# Patient Record
Sex: Male | Born: 1985 | Race: White | Hispanic: No | Marital: Single | Smoking: Current every day smoker
Health system: Southern US, Community
[De-identification: ages and names within clinical notes are randomized; demographics above are authoritative.]

## PROBLEM LIST (undated history)

## (undated) DIAGNOSIS — G40909 Epilepsy, unspecified, not intractable, without status epilepticus: Secondary | ICD-10-CM

## (undated) DIAGNOSIS — R569 Unspecified convulsions: Secondary | ICD-10-CM

## (undated) HISTORY — PX: ELBOW SURGERY: SHX618

---

## 2014-05-09 ENCOUNTER — Emergency Department: Payer: Self-pay | Admitting: Emergency Medicine

## 2015-02-14 ENCOUNTER — Encounter: Payer: Self-pay | Admitting: Emergency Medicine

## 2015-02-14 ENCOUNTER — Emergency Department
Admission: EM | Admit: 2015-02-14 | Discharge: 2015-02-14 | Disposition: A | Discharge status: Home / Self Care | Payer: Self-pay | Attending: Emergency Medicine | Admitting: Emergency Medicine

## 2015-02-14 DIAGNOSIS — Y998 Other external cause status: Secondary | ICD-10-CM | POA: Insufficient documentation

## 2015-02-14 DIAGNOSIS — X58XXXA Exposure to other specified factors, initial encounter: Secondary | ICD-10-CM | POA: Insufficient documentation

## 2015-02-14 DIAGNOSIS — Z720 Tobacco use: Secondary | ICD-10-CM | POA: Insufficient documentation

## 2015-02-14 DIAGNOSIS — Y9289 Other specified places as the place of occurrence of the external cause: Secondary | ICD-10-CM | POA: Insufficient documentation

## 2015-02-14 DIAGNOSIS — T1581XA Foreign body in other and multiple parts of external eye, right eye, initial encounter: Secondary | ICD-10-CM | POA: Insufficient documentation

## 2015-02-14 DIAGNOSIS — Y9389 Activity, other specified: Secondary | ICD-10-CM | POA: Insufficient documentation

## 2015-02-14 DIAGNOSIS — T1591XA Foreign body on external eye, part unspecified, right eye, initial encounter: Secondary | ICD-10-CM

## 2015-02-14 MED ORDER — TOBRAMYCIN 0.3 % OP SOLN: 2.0000 [drp] | OPHTHALMIC | Status: DC

## 2015-02-14 MED ORDER — TETRACAINE HCL 0.5 % OP SOLN
1.0000 [drp] | Freq: Once | OPHTHALMIC | Status: AC
  Administered 2015-02-14: 1 [drp] via OPHTHALMIC
  Filled 2015-02-14: qty 2

## 2015-02-14 MED ORDER — EYE WASH OPHTH SOLN
1.0000 [drp] | OPHTHALMIC | Status: DC | PRN
  Administered 2015-02-14: 1 [drp] via OPHTHALMIC
  Filled 2015-02-14: qty 118

## 2015-02-14 MED ORDER — FLUORESCEIN SODIUM 1 MG OP STRP
1.0000 | ORAL_STRIP | Freq: Once | OPHTHALMIC | Status: AC
  Administered 2015-02-14: 1 via OPHTHALMIC
  Filled 2015-02-14: qty 1

## 2015-02-14 NOTE — ED Notes (Signed)
Pt reports piece of metal in eye x1 week; reports continued pain. Reports trying to flush out with water. Pt reports some blurred vision.

## 2015-02-14 NOTE — ED Provider Notes (Signed)
[facility] Emergency Department Provider Note  ____________________________________________  Time seen: Approximately 6:38 PM  I have reviewed the triage vital signs and the nursing notes.   HISTORY  Chief Complaint Foreign Body in Eye   HPI Rodney Freeman is a 29 y.o. male is here complaining apiece metal in his eye for approximately one week. He states he was not able to see until the day and has not had any pain until today. He has some minimal photophobia and some blurred vision. He has tried multiple times to flush the piece of metal out. He has had similar to this in the past which was removed without any difficulty. Currently his pain is 5 out of 10. Visual acuity was obtained by the nurses.   History reviewed. No pertinent past medical history.  There are no active problems to display for this patient.   Past Surgical History  Procedure Laterality Date  . Elbow surgery Right     Current Outpatient Rx  Name  Route  Sig  Dispense  Refill  . tobramycin (TOBREX) 0.3 % ophthalmic solution   Right Eye   Place 2 drops into the right eye every 4 (four) hours.   5 mL   0     Allergies Review of patient's allergies indicates no known allergies.  No family history on file.  Social History Social History  Substance Use Topics  . Smoking status: Current Every Day Smoker  . Smokeless tobacco: None  . Alcohol Use: No    Review of Systems Constitutional: No fever/chills Eyes: Blurred vision ENT: No sore throat. Cardiovascular: Denies chest pain. Respiratory: Denies shortness of breath. Gastrointestinal:   No nausea, no vomiting.  Genitourinary: Negative for dysuria. Musculoskeletal: Negative for back pain. Skin: Negative for rash. Neurological: Negative for headaches, focal weakness or numbness.  10-point ROS otherwise negative.  ____________________________________________   PHYSICAL EXAM:  VITAL SIGNS: ED Triage Vitals  Enc  Vitals Group     BP 02/14/15 1805 131/80 mmHg     Pulse Rate 02/14/15 1805 68     Resp 02/14/15 1805 16     Temp 02/14/15 1805 97.8 F (36.6 C)     Temp Source 02/14/15 1805 Oral     SpO2 02/14/15 1805 97 %     Weight 02/14/15 1805 140 lb (63.504 kg)     Height 02/14/15 1805  (1.727 m)     Head Cir --      Peak Flow --      Pain Score 02/14/15 1805 5     Pain Loc --      Pain Edu? --      Excl. in GC? --     Constitutional: Alert and oriented. Well appearing and in no acute distress. Eyes: PERRL. EOMI. visual acuity was 20/25 in both eyes. Right right is injected with pinpoint metallic foreign body at approximately 10:00 position on the iris. Head: Atraumatic. Nose: No congestion/rhinnorhea. Neck: No stridor.   Cardiovascular: Normal rate, regular rhythm. Grossly normal heart sounds.  Good peripheral circulation. Respiratory: Normal respiratory effort.  No retractions. Lungs CTAB. Gastrointestinal: Soft and nontender. No distention.  Musculoskeletal: No lower extremity tenderness nor edema.  No joint effusions. Neurologic:  Normal speech and language. No gross focal neurologic deficits are appreciated. No gait instability. Skin:  Skin is warm, dry and intact. No rash noted. Psychiatric: Mood and affect are normal. Speech and behavior are normal.  ____________________________________________   LABS (all labs ordered are listed,  but only abnormal results are displayed)  Labs Reviewed - No data to display  PROCEDURES  Procedure(s) performed: Tetracaine was placed in the right eye. Upper eyelid was inverted without foreign body noted. Metallic foreign body was noted at approximately 10:00 position on the iris. This was removed with a wet Q-tip. Fluorescein dye was placed with uptake in the same area. Eye was irrigated with ophthalmic eyewash. No remaining foreign body was noted.  Critical Care performed: No  ____________________________________________   INITIAL  IMPRESSION / ASSESSMENT AND PLAN / ED COURSE  Pertinent labs & imaging results that were available during my care of the patient were reviewed by me and considered in my medical decision making (see chart for details).  Patient was advised to follow-up at [facility]. He is red prescription for Tobrex ophthalmic to be used every 4 hours while awake. He is to return to the emergency room the weekend if any severe worsening of his symptoms. ____________________________________________   FINAL CLINICAL IMPRESSION(S) / ED DIAGNOSES  Final diagnoses:  Foreign body, eye, right, initial encounter      Tommi Rumps, PA-C 02/14/15 1955  Arnaldo Natal, MD 02/14/15 2127

## 2015-08-11 ENCOUNTER — Emergency Department
Admission: EM | Admit: 2015-08-11 | Discharge: 2015-08-11 | Disposition: A | Discharge status: Home / Self Care | Payer: Self-pay | Attending: Emergency Medicine | Admitting: Emergency Medicine

## 2015-08-11 ENCOUNTER — Encounter: Payer: Self-pay | Admitting: *Deleted

## 2015-08-11 DIAGNOSIS — Y9289 Other specified places as the place of occurrence of the external cause: Secondary | ICD-10-CM | POA: Insufficient documentation

## 2015-08-11 DIAGNOSIS — W228XXA Striking against or struck by other objects, initial encounter: Secondary | ICD-10-CM | POA: Insufficient documentation

## 2015-08-11 DIAGNOSIS — T1502XA Foreign body in cornea, left eye, initial encounter: Secondary | ICD-10-CM | POA: Insufficient documentation

## 2015-08-11 DIAGNOSIS — Y990 Civilian activity done for income or pay: Secondary | ICD-10-CM | POA: Insufficient documentation

## 2015-08-11 DIAGNOSIS — H18892 Other specified disorders of cornea, left eye: Secondary | ICD-10-CM

## 2015-08-11 DIAGNOSIS — Y9389 Activity, other specified: Secondary | ICD-10-CM | POA: Insufficient documentation

## 2015-08-11 DIAGNOSIS — T1592XA Foreign body on external eye, part unspecified, left eye, initial encounter: Secondary | ICD-10-CM

## 2015-08-11 DIAGNOSIS — Z79899 Other long term (current) drug therapy: Secondary | ICD-10-CM | POA: Insufficient documentation

## 2015-08-11 DIAGNOSIS — F17200 Nicotine dependence, unspecified, uncomplicated: Secondary | ICD-10-CM | POA: Insufficient documentation

## 2015-08-11 MED ORDER — TETRACAINE HCL 0.5 % OP SOLN
OPHTHALMIC | Status: AC
  Administered 2015-08-11: 2 [drp] via OPHTHALMIC
  Filled 2015-08-11: qty 2

## 2015-08-11 MED ORDER — ERYTHROMYCIN 5 MG/GM OP OINT
TOPICAL_OINTMENT | Freq: Once | OPHTHALMIC | Status: AC
  Administered 2015-08-11: 1 via OPHTHALMIC

## 2015-08-11 MED ORDER — ERYTHROMYCIN 5 MG/GM OP OINT
TOPICAL_OINTMENT | OPHTHALMIC | Status: AC
  Administered 2015-08-11: 1 via OPHTHALMIC
  Filled 2015-08-11: qty 1

## 2015-08-11 MED ORDER — FLUORESCEIN SODIUM 1 MG OP STRP
1.0000 | ORAL_STRIP | Freq: Once | OPHTHALMIC | Status: AC
  Administered 2015-08-11: 1 via OPHTHALMIC

## 2015-08-11 MED ORDER — FLUORESCEIN SODIUM 1 MG OP STRP
ORAL_STRIP | OPHTHALMIC | Status: AC
  Administered 2015-08-11: 1 via OPHTHALMIC
  Filled 2015-08-11: qty 1

## 2015-08-11 MED ORDER — TETRACAINE HCL 0.5 % OP SOLN
1.0000 [drp] | Freq: Once | OPHTHALMIC | Status: AC
  Administered 2015-08-11: 2 [drp] via OPHTHALMIC

## 2015-08-11 NOTE — ED Notes (Signed)
Patient with no complaints at this time. Respirations even and unlabored. Skin warm/dry. Discharge instructions reviewed with patient at this time. Patient given opportunity to voice concerns/ask questions. Patient discharged at this time and left Emergency Department with steady gait.   

## 2015-08-11 NOTE — ED Provider Notes (Signed)
[facility] Emergency Department Provider Note  ____________________________________________  Time seen: Approximately 3:40 AM  I have reviewed the triage vital signs and the nursing notes.   HISTORY  Chief Complaint Foreign Body in Eye    HPI Rodney Freeman is a 30 y.o. male who comes into the hospital with something in his eye. The patient reports that he thinks he has a piece of metal stuck in his left eye. It occurred while he was at work he said sometime between 8 AM and 5 PM but he is unsure exactly the time. He reports that he went home but his eye was irritating him so he decided to come in to get it taken out. He tried flushing it out and also tried to rub it out with a Q-tip but it did not help. 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 He reports that he was wearing eye protection at the time of the incident.   No past medical history  There are no active problems to display for this patient.   Past Surgical History  Procedure Laterality Date  . Elbow surgery Right     Current Outpatient Rx  Name  Route  Sig  Dispense  Refill  . tobramycin (TOBREX) 0.3 % ophthalmic solution   Right Eye   Place 2 drops into the right eye every 4 (four) hours.   5 mL   0     Allergies Review of patient's allergies indicates no known allergies.  No family history on file.  Social History Social History  Substance Use Topics  . Smoking status: Current Every Day Smoker  . Smokeless tobacco: None  . Alcohol Use: No    Review of Systems Constitutional: No fever/chills Eyes: No visual changes. ENT: Left eye foreign body Cardiovascular: Denies chest pain. Respiratory: Denies shortness of breath. Gastrointestinal: No abdominal pain.  No nausea, no vomiting.  No diarrhea.  No  constipation. Genitourinary: Negative for dysuria. Musculoskeletal: Negative for back pain. Skin: Negative for rash. Neurological: Negative for headaches, focal weakness or numbness.  10-point ROS otherwise negative.  ____________________________________________   PHYSICAL EXAM:  VITAL SIGNS: ED Triage Vitals  Enc Vitals Group     BP 08/11/15 0220 125/79 mmHg     Pulse Rate 08/11/15 0220 64     Resp 08/11/15 0220 18     Temp 08/11/15 0227 97.5 F (36.4 C)     Temp Source 08/11/15 0220 Oral     SpO2 08/11/15 0220 98 %     Weight 08/11/15 0220 135 lb (61.236 kg)     Height 08/11/15 0220  (1.702 m)     Head Cir --      Peak Flow --      Pain Score 08/11/15 0220 3     Pain Loc --      Pain Edu? --      Excl. in GC? --     Constitutional: Alert and oriented. Well appearing and in no acute distress. Eyes: The patient has a foreign body noted to his left eye over the iris with some mild erythema. Head: Atraumatic. Nose: No congestion/rhinnorhea. Mouth/Throat: Mucous membranes are moist.  Oropharynx non-erythematous. Cardiovascular: Normal rate, regular rhythm. Grossly normal heart sounds.   Respiratory: Normal respiratory effort.   Gastrointestinal: Soft and nontender,  Positive bowel sounds Musculoskeletal: No lower extremity tenderness nor edema. Neurologic:  Normal speech and language.  Skin:  Skin is warm, dry and intact.  Psychiatric: Mood and affect are normal.   ____________________________________________   LABS (all labs ordered are listed, but only abnormal results are displayed)  Labs Reviewed - No data to display ____________________________________________  EKG  None ____________________________________________  RADIOLOGY  None ____________________________________________   PROCEDURES  Procedure(s) performed: None  Critical Care performed: No  ____________________________________________   INITIAL IMPRESSION / ASSESSMENT AND PLAN /  ED COURSE  Pertinent labs & imaging results that were available during my care of the patient were reviewed by me and considered in my medical decision making (see chart for details).  This is a 30 year old male who comes into the hospital today with the left eye foreign body. The patient had this in his eye for most of the day. I did give the patient some tetracaine drops to his left eye and remove the foreign body with an 18-gauge needle. The patient did have a residual rust ring noted after the removal of the foreign body. I attempted to get some of it out but was unable to do so. I will have the patient follow-up with ophthalmology to have the rust ring removed. The patient received some erythromycin ointment here and be discharged to follow-up. ____________________________________________   FINAL CLINICAL IMPRESSION(S) / ED DIAGNOSES  Final diagnoses:  Eye foreign body, left, initial encounter  Corneal rust ring of left eye      Rebecka Apley, MD 08/11/15 [phone]

## 2015-08-11 NOTE — Discharge Instructions (Signed)
Eye Foreign Body  A foreign body refers to any object on the surface of the eye or in the eyeball that should not be there. A foreign body may be a small speck of dirt or dust, a hair or eyelash, a splinter, or any other object.   SIGNS AND SYMPTOMS  Symptoms depend on what the foreign body is and where it is in the eye. The most common locations are:    On the inner surface of the upper or lower eyelids or on the covering of the white part of the eye (conjunctiva). Symptoms in this location are:    Pain and irritation, especially when blinking.    The feeling that something is in the eye.   On the surface of the clear covering on the front of the eye (cornea). Symptoms in this location include:    Pain and irritation.     Small "rust rings" around a metallic foreign body.    The feeling that something is in the eye.    Inside the eyeball. Foreign bodies inside the eye may cause:     Great pain.     Immediate loss of vision.     Distortion of the pupil.  DIAGNOSIS   Foreign bodies are found during an exam by an eye specialist. Those on the eyelids, conjunctiva, or cornea are usually (but not always) easily found. When a foreign body is inside the eyeball, a cloudiness of the lens (cataract) may form almost right away. This makes it hard for an eye specialist to find the foreign body. Tests may be needed, including ultrasound testing, X-rays, and CT scans.  TREATMENT    Foreign bodies on the eyelids, conjunctiva, or cornea are often removed easily and painlessly.   Rust in the cornea may require the use of a drill-like instrument to remove the rust.   If the foreign body has caused a scratch or a rubbing or scraping (abrasion) of the cornea, this may be treated with antibiotic drops or ointment. A pressure patch may be put over your eye.   If the foreign body is inside your eyeball, surgery is needed right away. This is a medical emergency. Foreign bodies inside the eye threaten vision. A person may even  lose his or her eye.  HOME CARE INSTRUCTIONS    Take medicines only as directed by your health care provider. Use eye drops or ointment as directed.   If no eye patch was applied:    Keep your eye closed as much as possible.    Do not rub your eye.    Wear dark glasses as needed to protect your eyes from bright light.    Do not wear contact lenses until your eye feels normal again, or as instructed by your health care provider.    Wear a protective eye covering if there is a risk of eye injury. This is important when working with high-speed tools.   If your eye is patched:    Follow your health care provider's instructions for when to remove the patch.    Do notdrive or operate machinery if your eye is patched. Your ability to judge distances is impaired.   Keep all follow-up visits as directed by your health care provider. This is important.  SEEK MEDICAL CARE IF:    You have increased pain in your eye.   Your vision gets worse.    You have problems with your eye patch.    You have fluid (discharge)   coming from your injured eye.    You have redness and swelling around your affected eye.   MAKE SURE YOU:    Understand these instructions.   Will watch your condition.   Will get help right away if you are not doing well or get worse.     This information is not intended to replace advice given to you by your health care provider. Make sure you discuss any questions you have with your health care provider.     Document Released: 05/30/2005 Document Revised: 06/20/2014 Document Reviewed: 10/25/2012  Elsevier Interactive Patient Education 2016 [employer]

## 2015-08-11 NOTE — ED Notes (Signed)
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  Left eye red.  Sx for 1 day

## 2015-09-25 ENCOUNTER — Encounter: Payer: Self-pay | Admitting: Emergency Medicine

## 2015-09-25 ENCOUNTER — Emergency Department
Admission: EM | Admit: 2015-09-25 | Discharge: 2015-09-25 | Disposition: A | Discharge status: Home / Self Care | Payer: Self-pay | Attending: Emergency Medicine | Admitting: Emergency Medicine

## 2015-09-25 DIAGNOSIS — Y939 Activity, unspecified: Secondary | ICD-10-CM | POA: Insufficient documentation

## 2015-09-25 DIAGNOSIS — W3189XA Contact with other specified machinery, initial encounter: Secondary | ICD-10-CM | POA: Insufficient documentation

## 2015-09-25 DIAGNOSIS — Y998 Other external cause status: Secondary | ICD-10-CM | POA: Insufficient documentation

## 2015-09-25 DIAGNOSIS — F17200 Nicotine dependence, unspecified, uncomplicated: Secondary | ICD-10-CM | POA: Insufficient documentation

## 2015-09-25 DIAGNOSIS — S61411A Laceration without foreign body of right hand, initial encounter: Secondary | ICD-10-CM

## 2015-09-25 DIAGNOSIS — Y929 Unspecified place or not applicable: Secondary | ICD-10-CM | POA: Insufficient documentation

## 2015-09-25 MED ORDER — LIDOCAINE HCL (PF) 1 % IJ SOLN
5.0000 mL | Freq: Once | INTRAMUSCULAR | Status: AC
  Administered 2015-09-25: 5 mL via INTRADERMAL
  Filled 2015-09-25: qty 5

## 2015-09-25 NOTE — Discharge Instructions (Signed)
Laceration Care, Adult  A laceration is a cut that goes through all layers of the skin. The cut also goes into the tissue that is right under the skin. Some cuts heal on their own. Others need to be closed with stitches (sutures), staples, skin adhesive strips, or wound glue. Taking care of your cut lowers your risk of infection and helps your cut to heal better.  HOW TO TAKE CARE OF YOUR CUT  For stitches or staples:  · Keep the wound clean and dry.  · If you were given a bandage (dressing), you should change it at least one time per day or as told by your doctor. You should also change it if it gets wet or dirty.  · Keep the wound completely dry for the first 24 hours or as told by your doctor. After that time, you may take a shower or a bath. However, make sure that the wound is not soaked in water until after the stitches or staples have been removed.  · Clean the wound one time each day or as told by your doctor:    Wash the wound with soap and water.    Rinse the wound with water until all of the soap comes off.    Pat the wound dry with a clean towel. Do not rub the wound.  · After you clean the wound, put a thin layer of antibiotic ointment on it as told by your doctor. This ointment:    Helps to prevent infection.    Keeps the bandage from sticking to the wound.  · Have your stitches or staples removed as told by your doctor.  If your doctor used skin adhesive strips:   · Keep the wound clean and dry.  · If you were given a bandage, you should change it at least one time per day or as told by your doctor. You should also change it if it gets dirty or wet.  · Do not get the skin adhesive strips wet. You can take a shower or a bath, but be careful to keep the wound dry.  · If the wound gets wet, pat it dry with a clean towel. Do not rub the wound.  · Skin adhesive strips fall off on their own. You can trim the strips as the wound heals. Do not remove any strips that are still stuck to the wound. They will  fall off after a while.  If your doctor used wound glue:  · Try to keep your wound dry, but you may briefly wet it in the shower or bath. Do not soak the wound in water, such as by swimming.  · After you take a shower or a bath, gently pat the wound dry with a clean towel. Do not rub the wound.  · Do not do any activities that will make you really sweaty until the skin glue has fallen off on its own.  · Do not apply liquid, cream, or ointment medicine to your wound while the skin glue is still on.  · If you were given a bandage, you should change it at least one time per day or as told by your doctor. You should also change it if it gets dirty or wet.  · If a bandage is placed over the wound, do not let the tape for the bandage touch the skin glue.  · Do not pick at the glue. The skin glue usually stays on for 5-10 days. Then, it   falls off of the skin.  General Instructions   · To help prevent scarring, make sure to cover your wound with sunscreen whenever you are outside after stitches are removed, after adhesive strips are removed, or when wound glue stays in place and the wound is healed. Make sure to wear a sunscreen of at least 30 SPF.  · Take over-the-counter and prescription medicines only as told by your doctor.  · If you were given antibiotic medicine or ointment, take or apply it as told by your doctor. Do not stop using the antibiotic even if your wound is getting better.  · Do not scratch or pick at the wound.  · Keep all follow-up visits as told by your doctor. This is important.  · Check your wound every day for signs of infection. Watch for:    Redness, swelling, or pain.    Fluid, blood, or pus.  · Raise (elevate) the injured area above the level of your heart while you are sitting or lying down, if possible.  GET HELP IF:  · You got a tetanus shot and you have any of these problems at the injection site:    Swelling.    Very bad pain.    Redness.    Bleeding.  · You have a fever.  · A wound that was  closed breaks open.  · You notice a bad smell coming from your wound or your bandage.  · You notice something coming out of the wound, such as wood or glass.  · Medicine does not help your pain.  · You have more redness, swelling, or pain at the site of your wound.  · You have fluid, blood, or pus coming from your wound.  · You notice a change in the color of your skin near your wound.  · You need to change the bandage often because fluid, blood, or pus is coming from the wound.  · You start to have a new rash.  · You start to have numbness around the wound.  GET HELP RIGHT AWAY IF:  · You have very bad swelling around the wound.  · Your pain suddenly gets worse and is very bad.  · You notice painful lumps near the wound or on skin that is anywhere on your body.  · You have a red streak going away from your wound.  · The wound is on your hand or foot and you cannot move a finger or toe like you usually can.  · The wound is on your hand or foot and you notice that your fingers or toes look pale or bluish.     This information is not intended to replace advice given to you by your health care provider. Make sure you discuss any questions you have with your health care provider.     Document Released: 11/16/2007 Document Revised: 10/14/2014 Document Reviewed: 05/26/2014  Elsevier Interactive Patient Education ©2016 [employer]

## 2015-09-25 NOTE — ED Provider Notes (Signed)
CSN: [id number]     Arrival date & time 09/25/15  1912 History   First MD Initiated Contact with Patient 09/25/15 2023     Chief Complaint  Patient presents with  . Laceration     HPI   30 year old male who presents to the emergency department for evaluation of a laceration to his right hand. He and his friend were working with a grinder and he accidentally cut the back of his hand. He states immediately afterward they rinsed it with water and covered it. Bleeding is fairly well controlled at this time. Tetanus shot is up-to-date.   History reviewed. No pertinent past medical history. Past Surgical History  Procedure Laterality Date  . Elbow surgery Right    History reviewed. No pertinent family history. Social History  Substance Use Topics  . Smoking status: Current Every Day Smoker  . Smokeless tobacco: None  . Alcohol Use: No    Review of Systems  Constitutional: Negative.   Eyes: Negative for pain.  Musculoskeletal: Negative for myalgias and arthralgias.  Skin: Positive for wound.  Neurological: Negative for numbness.  Hematological: Does not bruise/bleed easily.      Allergies  Review of patient's allergies indicates no known allergies.  Home Medications   Prior to Admission medications   Medication Sig Start Date End Date Taking? Authorizing Provider  tobramycin (TOBREX) 0.3 % ophthalmic solution Place 2 drops into the right eye every 4 (four) hours. 02/14/15   Tommi Rumpshonda L Summers, PA-C   BP 109/67 mmHg  Pulse 59  Temp(Src) 98.1 F (36.7 C) (Oral)  Resp 16  Ht 5\' 7"  (1.702 m)  Wt 61.236 kg  BMI 21.14 kg/m2  SpO2 98% Physical Exam  Constitutional: He is oriented to person, place, and time. He appears well-developed and well-nourished.  HENT:  Head: Atraumatic.  Eyes: EOM are normal.  Neck: Normal range of motion.  Pulmonary/Chest: Effort normal.  Musculoskeletal: Normal range of motion.  Neurological: He is alert and oriented to person, place, and time.    Skin: Skin is warm and dry. Laceration noted.     Psychiatric: He has a normal mood and affect. His behavior is normal. Judgment and thought content normal.  Nursing note and vitals reviewed.   ED Course  .Marland Kitchen.Laceration Repair Date/Time: 09/25/2015 9:08 PM Performed by: Kem BoroughsRIPLETT, Kairav Russomanno B Authorized by: Kem BoroughsRIPLETT, Farzad Tibbetts B Consent: Verbal consent obtained. Consent given by: patient Body area: upper extremity Location details: right hand Laceration length: 3 cm Foreign bodies: no foreign bodies Tendon involvement: none Nerve involvement: none Vascular damage: no Anesthesia: local infiltration Local anesthetic: lidocaine 1% without epinephrine Preparation: Patient was prepped and draped in the usual sterile fashion. Irrigation solution: saline Irrigation method: syringe Amount of cleaning: extensive Debridement: minimal Degree of undermining: none Skin closure: 5-0 nylon Number of sutures: 6 Technique: simple Approximation: close Approximation difficulty: simple Dressing: gauze roll, antibiotic ointment and pressure dressing   (including critical care time) Labs Review Labs Reviewed - No data to display  Imaging Review No results found. I have personally reviewed and evaluated these images and lab results as part of my medical decision-making.   EKG Interpretation None      MDM   Final diagnoses:  Laceration of hand, right, initial encounter    Patient was instructed to follow up with urgent care for suture removal in 10-12 days. He is instructed to keep the wound dry for the next 24 hours. He was instructed to keep the wound clean and apply antibiotic ointment  twice per day until sutures are removed. He was encouraged to keep the wound clean while the sutures are in. He was instructed to return to the emergency department or follow-up with primary care for any symptoms or concern of infection.    Chinita Pester, FNP 09/25/15 2112  Sharman Cheek, MD 09/29/15  [phone]

## 2015-09-25 NOTE — ED Notes (Signed)
Reports laceration to left hand with a grinder earlier today.

## 2015-10-19 ENCOUNTER — Emergency Department (HOSPITAL_COMMUNITY)
Admission: EM | Admit: 2015-10-19 | Discharge: 2015-10-19 | Disposition: A | Discharge status: Home / Self Care | Payer: Self-pay

## 2015-10-19 NOTE — ED Notes (Signed)
Called for 2x in waiting room. No answer. Charge RN said she called prior too as well with no answer.

## 2015-11-08 ENCOUNTER — Emergency Department
Admission: EM | Admit: 2015-11-08 | Discharge: 2015-11-08 | Disposition: A | Discharge status: Home / Self Care | Payer: Self-pay | Attending: Emergency Medicine | Admitting: Emergency Medicine

## 2015-11-08 DIAGNOSIS — T1501XA Foreign body in cornea, right eye, initial encounter: Secondary | ICD-10-CM | POA: Insufficient documentation

## 2015-11-08 DIAGNOSIS — Y929 Unspecified place or not applicable: Secondary | ICD-10-CM | POA: Insufficient documentation

## 2015-11-08 DIAGNOSIS — Y999 Unspecified external cause status: Secondary | ICD-10-CM | POA: Insufficient documentation

## 2015-11-08 DIAGNOSIS — Y939 Activity, unspecified: Secondary | ICD-10-CM | POA: Insufficient documentation

## 2015-11-08 DIAGNOSIS — F17200 Nicotine dependence, unspecified, uncomplicated: Secondary | ICD-10-CM | POA: Insufficient documentation

## 2015-11-08 DIAGNOSIS — W228XXA Striking against or struck by other objects, initial encounter: Secondary | ICD-10-CM | POA: Insufficient documentation

## 2015-11-08 DIAGNOSIS — T1591XA Foreign body on external eye, part unspecified, right eye, initial encounter: Secondary | ICD-10-CM

## 2015-11-08 MED ORDER — FLUORESCEIN SODIUM 1 MG OP STRP
1.0000 | ORAL_STRIP | Freq: Once | OPHTHALMIC | Status: AC
  Administered 2015-11-08: 1 via OPHTHALMIC
  Filled 2015-11-08: qty 1

## 2015-11-08 MED ORDER — EYE WASH OPHTH SOLN
1.0000 [drp] | OPHTHALMIC | Status: DC | PRN
  Administered 2015-11-08: 1 [drp] via OPHTHALMIC
  Filled 2015-11-08: qty 118

## 2015-11-08 MED ORDER — TETRACAINE HCL 0.5 % OP SOLN
1.0000 [drp] | Freq: Once | OPHTHALMIC | Status: AC
  Administered 2015-11-08: 1 [drp] via OPHTHALMIC
  Filled 2015-11-08: qty 2

## 2015-11-08 MED ORDER — TOBRAMYCIN 0.3 % OP SOLN: 2.0000 [drp] | OPHTHALMIC | Status: DC

## 2015-11-08 NOTE — ED Notes (Signed)
Pt reports to ED w/ c/o metal in eye.  Pt sts that it has been in R eye 2-3 weeks.  Denies chgs in vision, c/o light sensitivity

## 2015-11-08 NOTE — ED Notes (Signed)
Metal speck in right eye, x5day

## 2015-11-08 NOTE — ED Provider Notes (Signed)
[facility] Emergency Department Provider Note   ____________________________________________  Time seen: Approximately 11:57 AM  I have reviewed the triage vital signs and the nursing notes.   HISTORY  Chief Complaint Foreign Body in Eye   HPI Rodney Freeman is a 30 y.o. male is here complaining of metal in his eye for approximately 2-3 weeks. Patient states that there is been no changes in his vision other than he is very light sensitive. He states he has not seen an eye doctor for this or anyone prior  as he has no insurance. He states that he generally gets something in his eye about 6 or 7 times per year and always comes to the emergency room.Patient states that although he has been seen here in the emergency room and has been referred to the ophthalmology department he has never followed up. He states he does have eye protection when he is working. Currently he is very light sensitive and rates his pain a 10 out of 10 when in the sunlight.   History reviewed. No pertinent past medical history.  There are no active problems to display for this patient.   Past Surgical History  Procedure Laterality Date  . Elbow surgery Right     Current Outpatient Rx  Name  Route  Sig  Dispense  Refill  . tobramycin (TOBREX) 0.3 % ophthalmic solution   Right Eye   Place 2 drops into the right eye every 4 (four) hours. While awake   5 mL   0     Allergies Review of patient's allergies indicates no known allergies.  No family history on file.  Social History Social History  Substance Use Topics  . Smoking status: Current Every Day Smoker  . Smokeless tobacco: None  . Alcohol Use: No    Review of Systems Constitutional: No fever/chills Eyes:Positive photophobia. Positive foreign body right eye. ENT: No sore throat. Cardiovascular: Denies chest pain. Respiratory: Denies shortness of breath. Gastrointestinal:  No nausea, no  vomiting. Musculoskeletal: Negative for back pain. Skin: Negative for rash. Neurological: Negative for headaches, focal weakness or numbness.  10-point ROS otherwise negative.  ____________________________________________   PHYSICAL EXAM:  VITAL SIGNS: ED Triage Vitals  Enc Vitals Group     BP 11/08/15 1152 112/66 mmHg     Pulse Rate 11/08/15 1152 66     Resp 11/08/15 1152 16     Temp 11/08/15 1152 98.1 F (36.7 C)     Temp Source 11/08/15 1152 Oral     SpO2 11/08/15 1152 98 %     Weight 11/08/15 1152 135 lb (61.236 kg)     Height 11/08/15 1152 5\' 7"  (1.702 m)     Head Cir --      Peak Flow --      Pain Score --      Pain Loc --      Pain Edu? --      Excl. in GC? --     Constitutional: Alert and oriented. Well appearing and in no acute distress.Patient is wearing sunglasses in the exam room. Eyes: Conjunctivae are Normal for the left eye. There is minimal injection noted to the right eye. PERRL. EOMI. there is a small metallic foreign body noted at approximately 3:00 on the outer aspect of the cornea. Head: Atraumatic. Nose: No congestion/rhinnorhea. Neck: No stridor.   Cardiovascular: Normal rate, regular rhythm. Grossly normal heart sounds.  Good peripheral circulation. Respiratory: Normal respiratory effort.  No retractions. Lungs CTAB.  Gastrointestinal: Soft and nontender. No distention.  Musculoskeletal: Moves upper and lower extremities without difficulty. Normal gait was noted. Neurologic:  Normal speech and language. No gross focal neurologic deficits are appreciated. No gait instability. Skin:  Skin is warm, dry and intact. No rash noted. Psychiatric: Mood and affect are normal. Speech and behavior are normal.  ____________________________________________   LABS (all labs ordered are listed, but only abnormal results are displayed)  Labs Reviewed - No data to display   PROCEDURES  Procedure(s) performed: Tetracaine was placed in the eye. Right eye  there is a metallic foreign body noted at approximately 3:00 position on the outer portion of the cornea. Foreign body was removed with minimal effort with a wet Q-tip. No rest ring was noted. There is flurosceine dye uptake.    Critical Care performed: No  ____________________________________________   INITIAL IMPRESSION / ASSESSMENT AND PLAN / ED COURSE  Pertinent labs & imaging results that were available during my care of the patient were reviewed by me and considered in my medical decision making (see chart for details).  Patient had foreign body that was removed and there is small corneal abrasion present with foreign body was. Patient was started on Tobrex ophthalmic solution 2 drops in right eye every 4 hours while awake. He is also given the name of Dr. Druscilla Brownie who is on-call for [facility]. He was encouraged to follow up and also continue wearing eye protection when at work. ____________________________________________   FINAL CLINICAL IMPRESSION(S) / ED DIAGNOSES  Final diagnoses:  Eye foreign body, right, initial encounter      NEW MEDICATIONS STARTED DURING THIS VISIT:  Discharge Medication List as of 11/08/2015 12:54 PM       Note:  This document was prepared using Dragon voice recognition software and may include unintentional dictation errors.    Tommi Rumps, PA-C 11/08/15 1512  Arnaldo Natal, MD 11/08/15 [phone]

## 2015-11-08 NOTE — Discharge Instructions (Signed)
Eye Foreign Body A foreign body is an object on or in the eye that should not be there. The object could be a speck of dirt or dust, a hair, an eyelash, a splinter, or any other object. HOME CARE  Take medicines only as told by your doctor. Use eye drops or ointment as told.  If no eye patch was put on:  Keep the eye closed as much as possible.  Do not rub the eye.  Wear dark glasses in bright light.  Do not wear contact lenses until the eye feels normal, or as told by your doctor.  Wear protective eye covering when needed, especially when using high-speed tools.  If your eye is patched:  Follow your doctor's instructions for when to remove the patch.  Do notdrive or use machines while the eye patch is on. Judging distances is hard to do while wearing a patch.  Keep all follow-up visits as told by your doctor. This is important. GET HELP IF:   Your pain gets worse.  Your vision gets worse.  You have problems with your eye patch.  You have fluid (discharge) coming from your eye.  You have redness and swelling around your eye. MAKE SURE YOU:   Understand these instructions.  Will watch your condition.  Will get help right away if you are not doing well or get worse.   This information is not intended to replace advice given to you by your health care provider. Make sure you discuss any questions you have with your health care provider.   Document Released: 11/17/2009 Document Revised: 06/20/2014 Document Reviewed: 10/25/2012 Elsevier Interactive Patient Education [employer]2016 Elsevier Inc.   Follow-up with Dr. Druscilla BrowniePorfilio at [facility] if any continued problems. Begin using Tobrex ophthalmic solution every 4 hours while awake. Wear sunglasses for eye protection from the light. Wear eye protection while working.

## 2015-11-29 ENCOUNTER — Emergency Department: Payer: No Typology Code available for payment source

## 2015-11-29 ENCOUNTER — Emergency Department
Admission: EM | Admit: 2015-11-29 | Discharge: 2015-11-29 | Disposition: A | Discharge status: Home / Self Care | Payer: No Typology Code available for payment source | Attending: Emergency Medicine | Admitting: Emergency Medicine

## 2015-11-29 ENCOUNTER — Encounter: Payer: Self-pay | Admitting: Emergency Medicine

## 2015-11-29 DIAGNOSIS — F17200 Nicotine dependence, unspecified, uncomplicated: Secondary | ICD-10-CM | POA: Diagnosis not present

## 2015-11-29 DIAGNOSIS — Y92410 Unspecified street and highway as the place of occurrence of the external cause: Secondary | ICD-10-CM | POA: Diagnosis not present

## 2015-11-29 DIAGNOSIS — M62838 Other muscle spasm: Secondary | ICD-10-CM | POA: Diagnosis not present

## 2015-11-29 DIAGNOSIS — Y9389 Activity, other specified: Secondary | ICD-10-CM | POA: Insufficient documentation

## 2015-11-29 DIAGNOSIS — S169XXA Unspecified injury of muscle, fascia and tendon at neck level, initial encounter: Secondary | ICD-10-CM | POA: Diagnosis present

## 2015-11-29 DIAGNOSIS — Y999 Unspecified external cause status: Secondary | ICD-10-CM | POA: Insufficient documentation

## 2015-11-29 DIAGNOSIS — S161XXA Strain of muscle, fascia and tendon at neck level, initial encounter: Secondary | ICD-10-CM | POA: Insufficient documentation

## 2015-11-29 MED ORDER — CYCLOBENZAPRINE HCL 10 MG PO TABS: 10.0000 mg | ORAL_TABLET | Freq: Three times a day (TID) | ORAL | Status: DC | PRN

## 2015-11-29 MED ORDER — KETOROLAC TROMETHAMINE 30 MG/ML IJ SOLN
30.0000 mg | Freq: Once | INTRAMUSCULAR | Status: AC
  Administered 2015-11-29: 30 mg via INTRAMUSCULAR
  Filled 2015-11-29: qty 1

## 2015-11-29 MED ORDER — NAPROXEN 500 MG PO TABS: 500.0000 mg | ORAL_TABLET | Freq: Two times a day (BID) | ORAL | Status: DC
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
## 2016-02-08 ENCOUNTER — Emergency Department
Admission: EM | Admit: 2016-02-08 | Discharge: 2016-02-08 | Disposition: A | Discharge status: Home / Self Care | Payer: Self-pay | Attending: Emergency Medicine | Admitting: Emergency Medicine

## 2016-02-08 ENCOUNTER — Encounter: Payer: Self-pay | Admitting: Emergency Medicine

## 2016-02-08 DIAGNOSIS — R42 Dizziness and giddiness: Secondary | ICD-10-CM | POA: Insufficient documentation

## 2016-02-08 DIAGNOSIS — R112 Nausea with vomiting, unspecified: Secondary | ICD-10-CM | POA: Insufficient documentation

## 2016-02-08 DIAGNOSIS — F17200 Nicotine dependence, unspecified, uncomplicated: Secondary | ICD-10-CM | POA: Insufficient documentation

## 2016-02-08 LAB — URINALYSIS COMPLETE WITH MICROSCOPIC (ARMC ONLY)
BILIRUBIN URINE: NEGATIVE
Bacteria, UA: NONE SEEN
GLUCOSE, UA: NEGATIVE mg/dL
HGB URINE DIPSTICK: NEGATIVE
LEUKOCYTES UA: NEGATIVE
NITRITE: NEGATIVE
PH: 6 (ref 5.0–8.0)
Protein, ur: NEGATIVE mg/dL
RBC / HPF: NONE SEEN RBC/hpf (ref 0–5)
Specific Gravity, Urine: 1.016 (ref 1.005–1.030)
WBC, UA: NONE SEEN WBC/hpf (ref 0–5)

## 2016-02-08 LAB — COMPREHENSIVE METABOLIC PANEL
ALK PHOS: 57 U/L (ref 38–126)
ALT: 12 U/L — AB (ref 17–63)
AST: 20 U/L (ref 15–41)
Albumin: 4.9 g/dL (ref 3.5–5.0)
Anion gap: 7 (ref 5–15)
BILIRUBIN TOTAL: 0.5 mg/dL (ref 0.3–1.2)
BUN: 16 mg/dL (ref 6–20)
CO2: 27 mmol/L (ref 22–32)
CREATININE: 0.84 mg/dL (ref 0.61–1.24)
Calcium: 9.6 mg/dL (ref 8.9–10.3)
Chloride: 106 mmol/L (ref 101–111)
GFR calc Af Amer: >60 mL/min (ref >60)
GLUCOSE: 98 mg/dL (ref 65–99)
Potassium: 4.1 mmol/L (ref 3.5–5.1)
Sodium: 140 mmol/L (ref 135–145)
TOTAL PROTEIN: 7.8 g/dL (ref 6.5–8.1)

## 2016-02-08 LAB — CBC
HCT: 44.8 % (ref 40.0–52.0)
Hemoglobin: 15.7 g/dL (ref 13.0–18.0)
MCH: 30.1 pg (ref 26.0–34.0)
MCHC: 35.1 g/dL (ref 32.0–36.0)
MCV: 85.7 fL (ref 80.0–100.0)
PLATELETS: 277 10*3/uL (ref 150–440)
RBC: 5.22 MIL/uL (ref 4.40–5.90)
RDW: 12.3 % (ref 11.5–14.5)
WBC: 7.7 10*3/uL (ref 3.8–10.6)

## 2016-02-08 LAB — LIPASE, BLOOD: Lipase: 27 U/L (ref 11–51)

## 2016-02-08 LAB — TROPONIN I: Troponin I: <0.03 ng/mL (ref <0.03)

## 2016-02-08 MED ORDER — ONDANSETRON HCL 4 MG/2ML IJ SOLN
4.0000 mg | Freq: Once | INTRAMUSCULAR | Status: AC
  Administered 2016-02-08: 4 mg via INTRAVENOUS
  Filled 2016-02-08: qty 2

## 2016-02-08 MED ORDER — PROMETHAZINE HCL 12.5 MG PO TABS: 12.5000 mg | ORAL_TABLET | Freq: Four times a day (QID) | ORAL | 0 refills | Status: DC | PRN

## 2016-02-08 MED ORDER — SODIUM CHLORIDE 0.9 % IV BOLUS (SEPSIS)
1000.0000 mL | Freq: Once | INTRAVENOUS | Status: AC
  Administered 2016-02-08: 1000 mL via INTRAVENOUS

## 2016-02-08 NOTE — ED Triage Notes (Signed)
Pt presents with nausea and vomiting on and off for about two mths now and has been seen for same. Pt paints cars for a living.

## 2016-02-08 NOTE — Discharge Instructions (Signed)
Stay hydrated  Take phenergan for nausea.   [facility].   Return to ER if you have worse nausea, weight loss, fevers, uncontrolled vomiting, severe headaches, passing out, dehydration

## 2016-02-08 NOTE — ED Provider Notes (Signed)
[facility]-EMERGENCY DEPARTMENT Provider Note   CSN: [id number] Arrival date & time: 02/08/16  0804     History   Chief Complaint Chief Complaint  Patient presents with  . Nausea  . Emesis    HPI Rodney Freeman is a 29 y.o. male otherwise healthy here presenting with intermittent nausea and vomiting, forgetfulness. 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 He states that over the last few months, he has been having intermittent nausea and vomiting. He also sometimes gets light headed and dizzy and had two episodes over the several months of forgetfulness. Denies weight loss or fevers. Has been unable to keep anything down yesterday. He states that he has no PCP and his health insurance has been active yet.   The history is provided by the patient.    History reviewed. No pertinent past medical history.  There are no active problems to display for this patient.   Past Surgical History:  Procedure Laterality Date  . ELBOW SURGERY Right        Home Medications    Prior to Admission medications   Medication Sig Start Date End Date Taking? Authorizing Provider  cyclobenzaprine (FLEXERIL) 10 MG tablet Take 1 tablet (10 mg total) by mouth 3 (three) times daily as needed for muscle spasms. Patient not taking: Reported on 02/08/2016 11/29/15   Jami L Hagler, PA-C  naproxen (NAPROSYN) 500 MG tablet Take 1 tablet (500 mg total) by mouth 2 (two) times daily with a meal. 11/29/15   Jami L Hagler, PA-C    Family History No family history on file.  Social History Social History  Substance Use Topics  . Smoking status: Current Every Day Smoker  . Smokeless tobacco: Never Used  . Alcohol use No     Allergies   Review of patient's allergies indicates no known allergies.   Review of Systems Review of Systems  Gastrointestinal: Positive for nausea and vomiting.  All other systems reviewed and are  negative.    Physical Exam Updated Vital Signs BP 118/81   Pulse (!) 56   Temp 97.8 F (36.6 C) (Oral)   Resp 14   Ht 5\' 7"  (1.702 m)   Wt 135 lb (61.2 kg)   SpO2 98%   BMI 21.14 kg/m   Physical Exam  Constitutional: He is oriented to person, place, and time.  Slightly anxious   HENT:  Head: Normocephalic.  MM slightly dry   Eyes: EOM are normal. Pupils are equal, round, and reactive to light.  Neck: Normal range of motion. Neck supple.  Cardiovascular: Normal rate, regular rhythm and normal heart sounds.   Pulmonary/Chest: Effort normal and breath sounds normal. No respiratory distress. He has no wheezes. He has no rales.  Abdominal: Soft. Bowel sounds are normal. He exhibits no distension. There is no tenderness.  Musculoskeletal: Normal range of motion.  Neurological: He is alert and oriented to person, place, and time.  Skin: Skin is warm.  Psychiatric: He has a normal mood and affect.  Nursing note and vitals reviewed.    ED Treatments / Results  Labs (all labs ordered are listed, but only abnormal results are displayed) Labs Reviewed  COMPREHENSIVE METABOLIC PANEL - Abnormal; Notable for the following:       Result Value   ALT 12 (*)    All other components within normal limits  URINALYSIS COMPLETEWITH MICROSCOPIC ([facility] ONLY) - Abnormal; Notable for the  following:    Color, Urine YELLOW (*)    APPearance CLEAR (*)    Ketones, ur 1+ (*)    Squamous Epithelial / LPF 0-5 (*)    All other components within normal limits  LIPASE, BLOOD  CBC  TROPONIN I    EKG  EKG Interpretation None       ED ECG REPORT I, Richardean Canalavid H Tinsleigh Slovacek, the attending physician, personally viewed and interpreted this ECG.   Date: 02/08/2016  EKG Time: 8:37 am  Rate: 60  Rhythm: normal EKG, normal sinus rhythm  Axis: right  Intervals:none  ST&T Change: nonspecific    Radiology No results found.  Procedures Procedures (including critical care time)  Medications Ordered in  ED Medications  ondansetron (ZOFRAN) injection 4 mg (4 mg Intravenous Given 02/08/16 0838)  sodium chloride 0.9 % bolus 1,000 mL (0 mLs Intravenous Stopped 02/08/16 0950)     Initial Impression / Assessment and Plan / ED Course  I have reviewed the triage vital signs and the nursing notes.  Pertinent labs & imaging results that were available during my care of the patient were reviewed by me and considered in my medical decision making (see chart for details).  Clinical Course   Rodney Freeman is a 30 y.o. male here with intermittent nausea, forgetfulness. A &O x 3 currently. Appears well. I think likely gastro vs side effect from chemicals at the job site. OP clear. No weight loss or fevers, low suspicion for malignancy. He has no PCP or insurance yet. Will get basic labs, hydrate and give zofran and PO trial. Will likely need to set up for outpatient PCP for further workup as needed.   11:16 AM Labs unremarkable. UA showed small amount of ketones. No vomiting in the ED. Will dc home with phenergan prn, [facility].    Final Clinical Impressions(s) / ED Diagnoses   Final diagnoses:  None    New Prescriptions New Prescriptions   No medications on file     Charlynne Panderavid Hsienta Adora Yeh, MD 02/08/16 1116

## 2016-02-08 NOTE — ED Notes (Signed)
E-signature box not working. Pt verbalized understanding of discharge instructions and denied questions. 

## 2016-09-15 ENCOUNTER — Emergency Department
Admission: EM | Admit: 2016-09-15 | Discharge: 2016-09-15 | Disposition: A | Discharge status: Home / Self Care | Payer: Self-pay | Attending: Emergency Medicine | Admitting: Emergency Medicine

## 2016-09-15 ENCOUNTER — Emergency Department: Payer: Self-pay

## 2016-09-15 ENCOUNTER — Encounter: Payer: Self-pay | Admitting: Emergency Medicine

## 2016-09-15 ENCOUNTER — Other Ambulatory Visit: Payer: Self-pay

## 2016-09-15 DIAGNOSIS — R569 Unspecified convulsions: Secondary | ICD-10-CM | POA: Insufficient documentation

## 2016-09-15 DIAGNOSIS — Z79899 Other long term (current) drug therapy: Secondary | ICD-10-CM | POA: Insufficient documentation

## 2016-09-15 DIAGNOSIS — F17210 Nicotine dependence, cigarettes, uncomplicated: Secondary | ICD-10-CM | POA: Insufficient documentation

## 2016-09-15 LAB — URINALYSIS, ROUTINE W REFLEX MICROSCOPIC
BACTERIA UA: NONE SEEN
Bilirubin Urine: NEGATIVE
GLUCOSE, UA: NEGATIVE mg/dL
Ketones, ur: NEGATIVE mg/dL
LEUKOCYTES UA: NEGATIVE
NITRITE: NEGATIVE
PH: 5 (ref 5.0–8.0)
PROTEIN: 30 mg/dL — AB
SPECIFIC GRAVITY, URINE: 1.02 (ref 1.005–1.030)
Squamous Epithelial / LPF: NONE SEEN

## 2016-09-15 LAB — URINE DRUG SCREEN, QUALITATIVE (ARMC ONLY)
AMPHETAMINES, UR SCREEN: NOT DETECTED
BARBITURATES, UR SCREEN: NOT DETECTED
BENZODIAZEPINE, UR SCRN: NOT DETECTED
CANNABINOID 50 NG, UR ~~LOC~~: POSITIVE — AB
COCAINE METABOLITE, UR ~~LOC~~: NOT DETECTED
MDMA (Ecstasy)Ur Screen: NOT DETECTED
METHADONE SCREEN, URINE: NOT DETECTED
Opiate, Ur Screen: NOT DETECTED
Phencyclidine (PCP) Ur S: NOT DETECTED
Tricyclic, Ur Screen: NOT DETECTED

## 2016-09-15 LAB — COMPREHENSIVE METABOLIC PANEL
ALT: 15 U/L — AB (ref 17–63)
ANION GAP: 9 (ref 5–15)
AST: 25 U/L (ref 15–41)
Albumin: 4.9 g/dL (ref 3.5–5.0)
Alkaline Phosphatase: 53 U/L (ref 38–126)
BUN: 16 mg/dL (ref 6–20)
CHLORIDE: 106 mmol/L (ref 101–111)
CO2: 25 mmol/L (ref 22–32)
CREATININE: 0.88 mg/dL (ref 0.61–1.24)
Calcium: 9.8 mg/dL (ref 8.9–10.3)
Glucose, Bld: 111 mg/dL — ABNORMAL HIGH (ref 65–99)
POTASSIUM: 4 mmol/L (ref 3.5–5.1)
SODIUM: 140 mmol/L (ref 135–145)
Total Bilirubin: 0.7 mg/dL (ref 0.3–1.2)
Total Protein: 7.8 g/dL (ref 6.5–8.1)

## 2016-09-15 LAB — CBC WITH DIFFERENTIAL/PLATELET
BASOS ABS: 0.1 10*3/uL (ref 0–0.1)
BASOS PCT: 1 %
Eosinophils Absolute: 0.2 10*3/uL (ref 0–0.7)
Eosinophils Relative: 1 %
HEMATOCRIT: 45.1 % (ref 40.0–52.0)
HEMOGLOBIN: 15.6 g/dL (ref 13.0–18.0)
LYMPHS PCT: 16 %
Lymphs Abs: 2 10*3/uL (ref 1.0–3.6)
MCH: 29.4 pg (ref 26.0–34.0)
MCHC: 34.5 g/dL (ref 32.0–36.0)
MCV: 85.2 fL (ref 80.0–100.0)
Monocytes Absolute: 0.8 10*3/uL (ref 0.2–1.0)
Monocytes Relative: 7 %
NEUTROS ABS: 9.7 10*3/uL — AB (ref 1.4–6.5)
NEUTROS PCT: 75 %
Platelets: 307 10*3/uL (ref 150–440)
RBC: 5.29 MIL/uL (ref 4.40–5.90)
RDW: 12.1 % (ref 11.5–14.5)
WBC: 12.7 10*3/uL — AB (ref 3.8–10.6)

## 2016-09-15 LAB — ETHANOL

## 2016-09-15 LAB — LIPASE, BLOOD: LIPASE: 19 U/L (ref 11–51)

## 2016-09-15 NOTE — Discharge Instructions (Signed)
Today you had her first lifetime seizure. Fortunately her head CT and your blood work was very normal. After discussion with the on-call neurologist you will not be prescribed antiseizure medication, but you do need to follow-up with a neurologist for an MRI and an EEG.  Please return to the emergency department for any concerns such as if you have another seizure.  It was a pleasure to take care of you today, and thank you for coming to our emergency department.  If you have any questions or concerns before leaving please ask the nurse to grab me and I'm more than happy to go through your aftercare instructions again.  If you were prescribed any opioid pain medication today such as Norco, Vicodin, Percocet, morphine, hydrocodone, or oxycodone please make sure you do not drive when you are taking this medication as it can alter your ability to drive safely.  If you have any concerns once you are home that you are not improving or are in fact getting worse before you can make it to your follow-up appointment, please do not hesitate to call 911 and come back for further evaluation.  Merrily Brittle MD  Results for orders placed or performed during the hospital encounter of 09/15/16  Comprehensive metabolic panel  Result Value Ref Range   Sodium 140 135 - 145 mmol/L   Potassium 4.0 3.5 - 5.1 mmol/L   Chloride 106 101 - 111 mmol/L   CO2 25 22 - 32 mmol/L   Glucose, Bld 111 (H) 65 - 99 mg/dL   BUN 16 6 - 20 mg/dL   Creatinine, Ser 1.61 0.61 - 1.24 mg/dL   Calcium 9.8 8.9 - 09.6 mg/dL   Total Protein 7.8 6.5 - 8.1 g/dL   Albumin 4.9 3.5 - 5.0 g/dL   AST 25 15 - 41 U/L   ALT 15 (L) 17 - 63 U/L   Alkaline Phosphatase 53 38 - 126 U/L   Total Bilirubin 0.7 0.3 - 1.2 mg/dL   GFR calc non Af Amer >60 >60 mL/min   GFR calc Af Amer >60 >60 mL/min   Anion gap 9 5 - 15  Lipase, blood  Result Value Ref Range   Lipase 19 11 - 51 U/L  Ethanol  Result Value Ref Range   Alcohol, Ethyl (B) <5 <5 mg/dL    CBC WITH DIFFERENTIAL  Result Value Ref Range   WBC 12.7 (H) 3.8 - 10.6 K/uL   RBC 5.29 4.40 - 5.90 MIL/uL   Hemoglobin 15.6 13.0 - 18.0 g/dL   HCT 04.5 40.9 - 81.1 %   MCV 85.2 80.0 - 100.0 fL   MCH 29.4 26.0 - 34.0 pg   MCHC 34.5 32.0 - 36.0 g/dL   RDW 91.4 78.2 - 95.6 %   Platelets 307 150 - 440 K/uL   Neutrophils Relative % 75 %   Neutro Abs 9.7 (H) 1.4 - 6.5 K/uL   Lymphocytes Relative 16 %   Lymphs Abs 2.0 1.0 - 3.6 K/uL   Monocytes Relative 7 %   Monocytes Absolute 0.8 0.2 - 1.0 K/uL   Eosinophils Relative 1 %   Eosinophils Absolute 0.2 0 - 0.7 K/uL   Basophils Relative 1 %   Basophils Absolute 0.1 0 - 0.1 K/uL  Urine Drug Screen, Qualitative ([facility] only)  Result Value Ref Range   Tricyclic, Ur Screen NONE DETECTED NONE DETECTED   Amphetamines, Ur Screen NONE DETECTED NONE DETECTED   MDMA (Ecstasy)Ur Screen NONE DETECTED NONE DETECTED  Cocaine Metabolite,Ur [place] NONE DETECTED NONE DETECTED   Opiate, Ur Screen NONE DETECTED NONE DETECTED   Phencyclidine (PCP) Ur S NONE DETECTED NONE DETECTED   Cannabinoid 50 Ng, Ur [place] POSITIVE (A) NONE DETECTED   Barbiturates, Ur Screen NONE DETECTED NONE DETECTED   Benzodiazepine, Ur Scrn NONE DETECTED NONE DETECTED   Methadone Scn, Ur NONE DETECTED NONE DETECTED  Urinalysis, Routine w reflex microscopic  Result Value Ref Range   Color, Urine YELLOW (A) YELLOW   APPearance CLEAR (A) CLEAR   Specific Gravity, Urine 1.020 1.005 - 1.030   pH 5.0 5.0 - 8.0   Glucose, UA NEGATIVE NEGATIVE mg/dL   Hgb urine dipstick SMALL (A) NEGATIVE   Bilirubin Urine NEGATIVE NEGATIVE   Ketones, ur NEGATIVE NEGATIVE mg/dL   Protein, ur 30 (A) NEGATIVE mg/dL   Nitrite NEGATIVE NEGATIVE   Leukocytes, UA NEGATIVE NEGATIVE   RBC / HPF 0-5 0 - 5 RBC/hpf   WBC, UA 0-5 0 - 5 WBC/hpf   Bacteria, UA NONE SEEN NONE SEEN   Squamous Epithelial / LPF NONE SEEN NONE SEEN   Mucous PRESENT    Hyaline Casts, UA PRESENT    Ct Head Wo Contrast  Result Date:  09/15/2016 CLINICAL DATA:  Patient with seizure like activity. EXAM: CT HEAD WITHOUT CONTRAST TECHNIQUE: Contiguous axial images were obtained from the base of the skull through the vertex without intravenous contrast. COMPARISON:  None. FINDINGS: Brain: No acute cortically based infarct, intracranial hemorrhage, mass lesion or mass-effect. There is a 1.5 x 0.8 cm fluid density collection overlying the right frontal lobe (image 11; series 2), favored to represent a small arachnoid cyst. Vascular: No hyperdense vessel or unexpected calcification. Skull: Normal. Negative for fracture or focal lesion. Sinuses/Orbits: No acute finding. Other: None. IMPRESSION: No acute intracranial process. Electronically Signed   By: Annia Belt M.D.   On: 09/15/2016 07:45

## 2016-09-15 NOTE — ED Notes (Signed)
Pt discharged home after verbalizing understanding of discharge instructions; nad noted. 

## 2016-09-15 NOTE — ED Triage Notes (Signed)
Pt presents to ED post seizure like activity. Pt states he had an episode of feel nauseated around 5 am so he got up to smoke a cigarette. Once he was standing pt wife states pt started to stumble and hit the dresser with his right arm and started to have what appeared to be a seizure. No hx of the same. Lasted approx 1 min. Pt bit the inside of his cheek. Denies incontinence. EMS arrived on scene and VS were WNL. Pt c/o generalized headache at this time. Pt able to answer questions without difficulty.

## 2016-09-15 NOTE — ED Provider Notes (Signed)
[facility] Emergency Department Provider Note  ____________________________________________   First MD Initiated Contact with Patient 09/15/16 0701     (approximate)  I have reviewed the triage vital signs and the nursing notes.   HISTORY  Chief Complaint Seizures    HPI Rodney Freeman is a 31 y.o. male who self presents to the emergency department after having her first lifetime generalized tonic-clonic seizure this morning. He was in his usual state of health this morning when he began to feel nauseated and hyperventilate. He felt his hands become clammy and numb and when he stood up to go outside to get some fresh air. Further history than elucidated by his wife. She said that he then began to stumble and fell back into her armsand had a 32nd generalized tonic-clonic seizure. It took him 30-40 minutes to become back to his baseline. At that point she had called 911 and when EMS arrived they recommended he come to the emergency department but he declined stating he would prefer to drive in. He says that he has had intermittent episodes similar to this for about 2 years but is never actually had a seizure. The episodes began with a vague sense of unease and then hyperventilation nausea and tingling in his hands.   History reviewed. No pertinent past medical history.  There are no active problems to display for this patient.   Past Surgical History:  Procedure Laterality Date  . ELBOW SURGERY Right     Prior to Admission medications   Medication Sig Start Date End Date Taking? Authorizing Provider  cyclobenzaprine (FLEXERIL) 10 MG tablet Take 1 tablet (10 mg total) by mouth 3 (three) times daily as needed for muscle spasms. Patient not taking: Reported on 02/08/2016 11/29/15   Jami L Hagler, PA-C  naproxen (NAPROSYN) 500 MG tablet Take 1 tablet (500 mg total) by mouth 2 (two) times daily with a meal. 11/29/15   Jami L Hagler, PA-C  promethazine (PHENERGAN)  12.5 MG tablet Take 1 tablet (12.5 mg total) by mouth every 6 (six) hours as needed for nausea or vomiting. 02/08/16   Charlynne Pander, MD    Allergies Patient has no known allergies.  No family history on file.  Social History Social History  Substance Use Topics  . Smoking status: Current Every Day Smoker    Packs/day: 1.00    Types: Cigarettes  . Smokeless tobacco: Never Used  . Alcohol use Yes    Review of Systems Constitutional: No fever/chills Eyes: No visual changes. ENT: No sore throat. Cardiovascular: Denies chest pain. Respiratory: Denies shortness of breath. Gastrointestinal: No abdominal pain.  Positive nausea, no vomiting.  No diarrhea.  No constipation. Genitourinary: Negative for dysuria. Musculoskeletal: Negative for back pain. Skin: Negative for rash. Neurological: Positive for seizure  10-point ROS otherwise negative.  ____________________________________________   PHYSICAL EXAM:  VITAL SIGNS: ED Triage Vitals  Enc Vitals Group     BP 09/15/16 0627 (!) 118/92     Pulse Rate 09/15/16 0627 71     Resp 09/15/16 0627 18     Temp 09/15/16 0627 98.2 F (36.8 C)     Temp Source 09/15/16 0627 Oral     SpO2 09/15/16 0627 96 %     Weight 09/15/16 0627 130 lb (59 kg)     Height 09/15/16 0627  (1.702 m)     Head Circumference --      Peak Flow --      Pain Score 09/15/16  0865 7     Pain Loc --      Pain Edu? --      Excl. in GC? --     Constitutional: Alert and oriented x 4 well appearing nontoxic no diaphoresis speaks in full, clear sentences Eyes: PERRL EOMI. Head: Atraumatic. Nose: No congestion/rhinnorhea. Mouth/Throat: Bites to the lateral aspect of his tongue on the right Neck: No stridor.   Cardiovascular: Normal rate, regular rhythm. Grossly normal heart sounds.  Good peripheral circulation. Respiratory: Normal respiratory effort.  No retractions. Lungs CTAB and moving good air Gastrointestinal: Soft nondistended nontender no  rebound no guarding no peritonitis no McBurney's tenderness negative Rovsing's no costovertebral tenderness negative Murphy's Musculoskeletal: No lower extremity edema   Neurologic:  Cranial nerves II through XII intact Pupils equal round and reactive to light midrange and brisk no nystagmus No pronator drift 5 out of 5 grips biceps triceps plantar flexion dorsiflexion 2+ DTRs no ankle clonus Skin:  Skin is warm, dry and intact. No rash noted. Psychiatric: Mood and affect are normal. Speech and behavior are normal.    ____________________________________________   DIFFERENTIAL  Seizure, metabolic derangement, ventricular tachycardia ____________________________________________   LABS (all labs ordered are listed, but only abnormal results are displayed)  Labs Reviewed  COMPREHENSIVE METABOLIC PANEL - Abnormal; Notable for the following:       Result Value   Glucose, Bld 111 (*)    ALT 15 (*)    All other components within normal limits  CBC WITH DIFFERENTIAL/PLATELET - Abnormal; Notable for the following:    WBC 12.7 (*)    Neutro Abs 9.7 (*)    All other components within normal limits  LIPASE, BLOOD  ETHANOL  URINE DRUG SCREEN, QUALITATIVE ([facility] ONLY)  URINALYSIS, ROUTINE W REFLEX MICROSCOPIC  PROLACTIN    Labs unremarkable aside from slight white count which is likely secondary to stress. Cannabis would actually be inhibitory for seizures  EKG  ED ECG REPORT I, Merrily Brittle, the attending physician, personally viewed and interpreted this ECG.  Date: 09/15/2016 Rate: 62 Rhythm: normal sinus rhythm QRS Axis: Rightward axis Intervals: normal ST/T Wave abnormalities: normal Conduction Disturbances: none Narrative Interpretation: unremarkable  ____________________________________________  RADIOLOGYHead CT with no acute disease ____________________________________________   PROCEDURES  Procedure(s) performed: no  Procedures  Critical Care performed:  no  ____________________________________________   INITIAL IMPRESSION / ASSESSMENT AND PLAN / ED COURSE  Pertinent labs & imaging results that were available during my care of the patient were reviewed by me and considered in my medical decision making (see chart for details).  On arrival the patient is back to his baseline but his history is concerning for first lifetime generalized tonic-clonic seizure. These episodes he's been having for 2 years were likely Aura.  ----------------------------------------- 7:49 AM on 09/15/2016 -----------------------------------------  Head CT just back is unremarkable. I have placed a neurology consult for possible antiepileptic recommendations. ____________________________________________   ----------------------------------------- 8:26 AM on 09/15/2016 -----------------------------------------  I discussed the case with the on-call telemetry neurologist Dr. Isaac Bliss who recommends no antiepileptic medication until the patient has another generalized tonic-clonic seizure or until she has an MRI or an EEG showing shoe seizure activity. The patient is stable for outpatient workup.  FINAL CLINICAL IMPRESSION(S) / ED DIAGNOSES  Final diagnoses:  Seizure (HCC)      NEW MEDICATIONS STARTED DURING THIS VISIT:  New Prescriptions   No medications on file     Note:  This document was prepared using Dragon voice recognition software and  may include unintentional dictation errors.     Merrily Brittle, MD 09/15/16 [phone]

## 2016-09-16 LAB — PROLACTIN: Prolactin: 14.2 ng/mL (ref 4.0–15.2)
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
## 2017-02-22 ENCOUNTER — Emergency Department
Admission: EM | Admit: 2017-02-22 | Discharge: 2017-02-22 | Disposition: A | Discharge status: Home / Self Care | Payer: Self-pay | Attending: Emergency Medicine | Admitting: Emergency Medicine

## 2017-02-22 ENCOUNTER — Encounter: Payer: Self-pay | Admitting: *Deleted

## 2017-02-22 ENCOUNTER — Emergency Department: Payer: Self-pay

## 2017-02-22 DIAGNOSIS — X500XXA Overexertion from strenuous movement or load, initial encounter: Secondary | ICD-10-CM | POA: Insufficient documentation

## 2017-02-22 DIAGNOSIS — Y929 Unspecified place or not applicable: Secondary | ICD-10-CM | POA: Insufficient documentation

## 2017-02-22 DIAGNOSIS — Z79899 Other long term (current) drug therapy: Secondary | ICD-10-CM | POA: Insufficient documentation

## 2017-02-22 DIAGNOSIS — F17210 Nicotine dependence, cigarettes, uncomplicated: Secondary | ICD-10-CM | POA: Insufficient documentation

## 2017-02-22 DIAGNOSIS — Y9389 Activity, other specified: Secondary | ICD-10-CM | POA: Insufficient documentation

## 2017-02-22 DIAGNOSIS — S39012A Strain of muscle, fascia and tendon of lower back, initial encounter: Secondary | ICD-10-CM | POA: Insufficient documentation

## 2017-02-22 DIAGNOSIS — Y999 Unspecified external cause status: Secondary | ICD-10-CM | POA: Insufficient documentation

## 2017-02-22 MED ORDER — HYDROCODONE-ACETAMINOPHEN 5-325 MG PO TABS: 1.0000 | ORAL_TABLET | Freq: Four times a day (QID) | ORAL | 0 refills | Status: DC | PRN

## 2017-02-22 MED ORDER — CYCLOBENZAPRINE HCL 5 MG PO TABS: 5.0000 mg | ORAL_TABLET | Freq: Three times a day (TID) | ORAL | 0 refills | Status: DC | PRN

## 2017-02-22 MED ORDER — ORPHENADRINE CITRATE 30 MG/ML IJ SOLN
60.0000 mg | Freq: Two times a day (BID) | INTRAMUSCULAR | Status: DC
  Administered 2017-02-22: 60 mg via INTRAMUSCULAR
  Filled 2017-02-22: qty 2

## 2017-02-22 MED ORDER — PREDNISONE 10 MG (21) PO TBPK: ORAL_TABLET | ORAL | 0 refills | Status: DC

## 2017-02-22 MED ORDER — DEXAMETHASONE SODIUM PHOSPHATE 10 MG/ML IJ SOLN
10.0000 mg | Freq: Once | INTRAMUSCULAR | Status: AC
  Administered 2017-02-22: 10 mg via INTRAMUSCULAR
  Filled 2017-02-22: qty 1

## 2017-02-22 NOTE — ED Triage Notes (Signed)
States he was lifting something heavy over his head and his back made a snap noise, states back pain at present

## 2017-02-22 NOTE — Discharge Instructions (Signed)
Take medication as prescribed. Return to emergency department if symptoms worsen and follow-up with PCP as needed.    Modify work activities, heavy lifting, twisting, pushing and pulling until symptoms have improved.

## 2017-02-22 NOTE — ED Notes (Addendum)
See triage note  States he was lifting something heavy  States he was lifting something over head  Felt pop  Now having lower back pain which is moving into left leg

## 2017-02-22 NOTE — ED Provider Notes (Signed)
[facility] Emergency Department Provider Note   ____________________________________________   I have reviewed the triage vital signs and the nursing notes.   HISTORY  Chief Complaint Back Pain    HPI Rodney Freeman is a 31 y.o. male presents to the emergency department with lumbar back pain after lifting a 6 x 6 x 20' treated lumbar earlier tonight. Patient described attempting to walk the piece of lumbar up on its end when he felt a "pop" in the lower lumbar area of his back. Patient reported immediate onset of pain without radicular symptoms. Patient denied bowel or bladder dysfunction or saddle anesthesia associated with current symptoms. Patient denied any past back injury or surgeries. Patient localizes pain to the lower lumbar segments and paraspinal musculature. Patient reported being ambulatory although walking, standing and sitting has been quite painful. Patient has not taken any medication or attempted any alleviating measures. Patient denies fever, chills, headache, vision changes, chest pain, chest tightness, shortness of breath, abdominal pain, nausea and vomiting.  History reviewed. No pertinent past medical history.  There are no active problems to display for this patient.   Past Surgical History:  Procedure Laterality Date  . ELBOW SURGERY Right     Prior to Admission medications   Medication Sig Start Date End Date Taking? Authorizing Provider  cyclobenzaprine (FLEXERIL) 5 MG tablet Take 1 tablet (5 mg total) by mouth 3 (three) times daily as needed. 02/22/17   [name] M, PA-C  HYDROcodone-acetaminophen (NORCO/VICODIN) 5-325 MG tablet Take 1 tablet by mouth every 6 (six) hours as needed for moderate pain. 02/22/17   [name] M, PA-C  naproxen (NAPROSYN) 500 MG tablet Take 1 tablet (500 mg total) by mouth 2 (two) times daily with a meal. 11/29/15   Hagler, Jami L, PA-C  predniSONE (STERAPRED UNI-PAK 21 TAB) 10 MG (21) TBPK tablet  Take 6 tablets on day 1. Take 5 tablets on day 2. Take 4 tablets on day 3. Take 3 tablets on day 4. Take 2 tablets on day 5. Take 1 tablets on day 6. 02/22/17   Gearold Wainer M, PA-C  promethazine (PHENERGAN) 12.5 MG tablet Take 1 tablet (12.5 mg total) by mouth every 6 (six) hours as needed for nausea or vomiting. 02/08/16   [name], [name], MD    Allergies Patient has no known allergies.  History reviewed. No pertinent family history.  Social History Social History  Substance Use Topics  . Smoking status: Current Every Day Smoker    Packs/day: 1.00    Types: Cigarettes  . Smokeless tobacco: Never Used  . Alcohol use Yes    Review of Systems Constitutional: Negative for fever/chills Eyes: No visual changes. ENT:  Negative for sore throat and for difficulty swallowing Cardiovascular: Denies chest pain. Respiratory: Denies cough. Denies shortness of breath. Gastrointestinal: No abdominal pain.  No nausea, vomiting, diarrhea. Genitourinary: Negative for dysuria. Musculoskeletal:Positive for lumbar back pain without radicular symptoms. Skin: Negative for rash. Neurological: Negative for headaches.  Negative focal weakness or numbness. Negative for loss of consciousness. Able to ambulate. ____________________________________________   PHYSICAL EXAM:  VITAL SIGNS: ED Triage Vitals  Enc Vitals Group     BP 02/22/17 1834 115/75     Pulse Rate 02/22/17 1834 86     Resp 02/22/17 1834 18     Temp 02/22/17 1834 98.4 F (36.9 C)     Temp Source 02/22/17 1834 Oral     SpO2 02/22/17 1834 95 %  Weight 02/22/17 1833 130 lb (59 kg)     Height 02/22/17 1833  (1.702 m)     Head Circumference --      Peak Flow --      Pain Score 02/22/17 1833 10     Pain Loc --      Pain Edu? --      Excl. in GC? --     Constitutional: Alert and oriented. Well appearing and in no acute distress.  Eyes: Conjunctivae are normal. PERRL. EOMI  Head: Normocephalic and atraumatic. ENT:       Ears: Canals clear. TMs intact bilaterally.      Nose: No congestion/rhinnorhea.      Mouth/Throat: Mucous membranes are moist. Neck:Supple. No thyromegaly. No stridor.  Cardiovascular: Normal rate, regular rhythm.  Good peripheral circulation. Respiratory: Normal respiratory effort without tachypnea or retractions. Lungs CTAB. No wheezes/rales/rhonchi. Hematological/Lymphatic/Immunological: No cervical lymphadenopathy. Cardiovascular: Normal rate, regular rhythm. Normal distal pulses. Gastrointestinal: Bowel sounds 4 quadrants. Soft and nontender to palpation. No guarding or rigidity. No palpable masses. No distention. No CVA tenderness. Musculoskeletal: Lumbar back pain without range of motion limitation or deformities noted. Negative radiculopathy. Palpable tenderness along lumbar paraspinals and bilateral PSISs. Intact lower extremity range of motion strength and sensation. Increased pain and symptoms with extreme lumbar flexion with neutral spine as alleviating factor. Neurologic: Normal speech and language.  Skin:  Skin is warm, dry and intact. No rash noted. Psychiatric: Mood and affect are normal. Speech and behavior are normal. Patient exhibits appropriate insight and judgement.  ____________________________________________   LABS (all labs ordered are listed, but only abnormal results are displayed)  Labs Reviewed - No data to display ____________________________________________  EKG None ____________________________________________  RADIOLOGY DG lumbar complete FINDINGS: Five non rib-bearing lumbar-type vertebral bodies are intact and aligned with maintenance of the lumbar lordosis. Intervertebral disc heights are normal. No destructive bony lesions.  Sacroiliac joints are symmetric. Included prevertebral and paraspinal soft tissue planes are non-suspicious. Phlebolith projects in LEFT  pelvis.  IMPRESSION: Negative. ____________________________________________   PROCEDURES  Procedure(s) performed: no    Critical Care performed: no ____________________________________________   INITIAL IMPRESSION / ASSESSMENT AND PLAN / ED COURSE  Pertinent labs & imaging results that were available during my care of the patient were reviewed by me and considered in my medical decision making (see chart for details).  Patient presents to emergency department with lumbar back pain pain without radiculopathy following lifting injury or earlier this evening. History, physical exam findings and imaging are reassuring symptoms are consistent with lumbar strain. Patient noted improvement of symptoms following Decadron and Norflex during the course of care in the emergency department. Patient will be prescribed prednisone taper and Flexeril as needed for muscle spasms. Patient will also be prescribed short course of Vicodin for 1-2 days for severe pain. Patient advised to follow up with orthopedics if symptoms do not resolve or return to the emergency department if symptoms return or worsen. Patient informed of clinical course, understand medical decision-making process, and agree with plan.   ____________________________________________   FINAL CLINICAL IMPRESSION(S) / ED DIAGNOSES  Final diagnoses:  Strain of lumbar region, initial encounter       NEW MEDICATIONS STARTED DURING THIS VISIT:  Discharge Medication List as of 02/22/2017  8:31 PM    START taking these medications   Details  HYDROcodone-acetaminophen (NORCO/VICODIN) 5-325 MG tablet Take 1 tablet by mouth every 6 (six) hours as needed for moderate pain., Starting Wed 02/22/2017, Print  predniSONE (STERAPRED UNI-PAK 21 TAB) 10 MG (21) TBPK tablet Take 6 tablets on day 1. Take 5 tablets on day 2. Take 4 tablets on day 3. Take 3 tablets on day 4. Take 2 tablets on day 5. Take 1 tablets on day 6., Print          Note:  This document was prepared using Dragon voice recognition software and may include unintentional dictation errors.    Percell Boston 02/22/17 2307    Jeanmarie Plant, MD 02/22/17 [phone]

## 2021-05-05 ENCOUNTER — Encounter (HOSPITAL_COMMUNITY): Payer: Self-pay

## 2021-05-05 ENCOUNTER — Inpatient Hospital Stay (HOSPITAL_COMMUNITY): Payer: Self-pay

## 2021-05-05 ENCOUNTER — Emergency Department (HOSPITAL_COMMUNITY): Payer: Self-pay

## 2021-05-05 ENCOUNTER — Other Ambulatory Visit: Payer: Self-pay

## 2021-05-05 ENCOUNTER — Inpatient Hospital Stay (HOSPITAL_COMMUNITY)
Admission: EM | Admit: 2021-05-05 | Discharge: 2021-05-05 | DRG: 101 | Discharge status: Against Medical Advice | Payer: Self-pay | Attending: Internal Medicine | Admitting: Internal Medicine

## 2021-05-05 DIAGNOSIS — Z20822 Contact with and (suspected) exposure to covid-19: Secondary | ICD-10-CM | POA: Diagnosis present

## 2021-05-05 DIAGNOSIS — R569 Unspecified convulsions: Secondary | ICD-10-CM

## 2021-05-05 DIAGNOSIS — Y906 Blood alcohol level of 120-199 mg/100 ml: Secondary | ICD-10-CM | POA: Diagnosis present

## 2021-05-05 DIAGNOSIS — F419 Anxiety disorder, unspecified: Secondary | ICD-10-CM | POA: Diagnosis present

## 2021-05-05 DIAGNOSIS — F10929 Alcohol use, unspecified with intoxication, unspecified: Secondary | ICD-10-CM

## 2021-05-05 DIAGNOSIS — G40909 Epilepsy, unspecified, not intractable, without status epilepticus: Principal | ICD-10-CM | POA: Diagnosis present

## 2021-05-05 DIAGNOSIS — J329 Chronic sinusitis, unspecified: Secondary | ICD-10-CM | POA: Diagnosis present

## 2021-05-05 DIAGNOSIS — F10229 Alcohol dependence with intoxication, unspecified: Secondary | ICD-10-CM | POA: Diagnosis present

## 2021-05-05 DIAGNOSIS — F17210 Nicotine dependence, cigarettes, uncomplicated: Secondary | ICD-10-CM | POA: Diagnosis present

## 2021-05-05 DIAGNOSIS — I10 Essential (primary) hypertension: Secondary | ICD-10-CM | POA: Diagnosis present

## 2021-05-05 DIAGNOSIS — Z79899 Other long term (current) drug therapy: Secondary | ICD-10-CM

## 2021-05-05 LAB — COMPREHENSIVE METABOLIC PANEL
ALT: 13 U/L (ref 0–44)
AST: 21 U/L (ref 15–41)
Albumin: 4 g/dL (ref 3.5–5.0)
Alkaline Phosphatase: 75 U/L (ref 38–126)
Anion gap: 10 (ref 5–15)
BUN: 8 mg/dL (ref 6–20)
CO2: 24 mmol/L (ref 22–32)
Calcium: 8.9 mg/dL (ref 8.9–10.3)
Chloride: 105 mmol/L (ref 98–111)
Creatinine, Ser: 0.86 mg/dL (ref 0.61–1.24)
GFR, Estimated: >60 mL/min (ref >60)
Glucose, Bld: 82 mg/dL (ref 70–99)
Potassium: 3.6 mmol/L (ref 3.5–5.1)
Sodium: 139 mmol/L (ref 135–145)
Total Bilirubin: 0.5 mg/dL (ref 0.3–1.2)
Total Protein: 6.9 g/dL (ref 6.5–8.1)

## 2021-05-05 LAB — CBC WITH DIFFERENTIAL/PLATELET
Abs Immature Granulocytes: 0.02 10*3/uL (ref 0.00–0.07)
Basophils Absolute: 0 10*3/uL (ref 0.0–0.1)
Basophils Relative: 1 %
Eosinophils Absolute: 0.1 10*3/uL (ref 0.0–0.5)
Eosinophils Relative: 1 %
HCT: 44.5 % (ref 39.0–52.0)
Hemoglobin: 15 g/dL (ref 13.0–17.0)
Immature Granulocytes: 0 %
Lymphocytes Relative: 45 %
Lymphs Abs: 2.3 10*3/uL (ref 0.7–4.0)
MCH: 29.9 pg (ref 26.0–34.0)
MCHC: 33.7 g/dL (ref 30.0–36.0)
MCV: 88.8 fL (ref 80.0–100.0)
Monocytes Absolute: 0.5 10*3/uL (ref 0.1–1.0)
Monocytes Relative: 9 %
Neutro Abs: 2.3 10*3/uL (ref 1.7–7.7)
Neutrophils Relative %: 44 %
Platelets: 374 10*3/uL (ref 150–400)
RBC: 5.01 MIL/uL (ref 4.22–5.81)
RDW: 11.1 % — ABNORMAL LOW (ref 11.5–15.5)
WBC: 5.2 10*3/uL (ref 4.0–10.5)
nRBC: 0 % (ref 0.0–0.2)

## 2021-05-05 LAB — RAPID URINE DRUG SCREEN, HOSP PERFORMED
Amphetamines: NOT DETECTED
Barbiturates: NOT DETECTED
Benzodiazepines: POSITIVE — AB
Cocaine: NOT DETECTED
Opiates: NOT DETECTED
Tetrahydrocannabinol: POSITIVE — AB

## 2021-05-05 LAB — URINALYSIS, ROUTINE W REFLEX MICROSCOPIC
Bilirubin Urine: NEGATIVE
Glucose, UA: NEGATIVE mg/dL
Hgb urine dipstick: NEGATIVE
Ketones, ur: NEGATIVE mg/dL
Leukocytes,Ua: NEGATIVE
Nitrite: NEGATIVE
Protein, ur: NEGATIVE mg/dL
Specific Gravity, Urine: 1.002 — ABNORMAL LOW (ref 1.005–1.030)
pH: 6 (ref 5.0–8.0)

## 2021-05-05 LAB — RESP PANEL BY RT-PCR (FLU A&B, COVID) ARPGX2
Influenza A by PCR: NEGATIVE
Influenza B by PCR: NEGATIVE
SARS Coronavirus 2 by RT PCR: NEGATIVE

## 2021-05-05 LAB — CBG MONITORING, ED: Glucose-Capillary: 89 mg/dL (ref 70–99)

## 2021-05-05 LAB — MAGNESIUM: Magnesium: 2.2 mg/dL (ref 1.7–2.4)

## 2021-05-05 LAB — ETHANOL: Alcohol, Ethyl (B): 181 mg/dL — ABNORMAL HIGH (ref <10)

## 2021-05-05 MED ORDER — THIAMINE HCL 100 MG PO TABS: 100.0000 mg | ORAL_TABLET | Freq: Every day | ORAL | Status: DC

## 2021-05-05 MED ORDER — LORAZEPAM 2 MG/ML IJ SOLN: 1.0000 mg | INTRAMUSCULAR | Status: DC | PRN

## 2021-05-05 MED ORDER — ENOXAPARIN SODIUM 40 MG/0.4ML IJ SOSY: 40.0000 mg | PREFILLED_SYRINGE | INTRAMUSCULAR | Status: DC

## 2021-05-05 MED ORDER — LORAZEPAM 2 MG/ML IJ SOLN: 0.0000 mg | INTRAMUSCULAR | Status: DC

## 2021-05-05 MED ORDER — ADULT MULTIVITAMIN W/MINERALS CH: 1.0000 | ORAL_TABLET | Freq: Every day | ORAL | Status: DC

## 2021-05-05 MED ORDER — LORAZEPAM 2 MG/ML IJ SOLN: 0.0000 mg | Freq: Three times a day (TID) | INTRAMUSCULAR | Status: DC

## 2021-05-05 MED ORDER — FOLIC ACID 1 MG PO TABS: 1.0000 mg | ORAL_TABLET | Freq: Every day | ORAL | Status: DC

## 2021-05-05 MED ORDER — LORAZEPAM 2 MG/ML IJ SOLN
INTRAMUSCULAR | Status: AC
  Administered 2021-05-05: 2 mg via INTRAVENOUS
  Filled 2021-05-05: qty 2

## 2021-05-05 MED ORDER — THIAMINE HCL 100 MG/ML IJ SOLN: 100.0000 mg | Freq: Every day | INTRAMUSCULAR | Status: DC

## 2021-05-05 MED ORDER — ACETAMINOPHEN 650 MG RE SUPP: 650.0000 mg | Freq: Four times a day (QID) | RECTAL | Status: DC | PRN

## 2021-05-05 MED ORDER — KETOROLAC TROMETHAMINE 15 MG/ML IJ SOLN: 15.0000 mg | Freq: Four times a day (QID) | INTRAMUSCULAR | Status: DC | PRN

## 2021-05-05 MED ORDER — ESCITALOPRAM OXALATE 10 MG PO TABS: 10.0000 mg | ORAL_TABLET | Freq: Every day | ORAL | Status: DC

## 2021-05-05 MED ORDER — THIAMINE HCL 100 MG/ML IJ SOLN
100.0000 mg | Freq: Once | INTRAMUSCULAR | Status: AC
  Administered 2021-05-05: 100 mg via INTRAVENOUS
  Filled 2021-05-05: qty 2

## 2021-05-05 MED ORDER — LACTATED RINGERS IV SOLN: INTRAVENOUS | Status: DC

## 2021-05-05 MED ORDER — ONDANSETRON HCL 4 MG PO TABS: 4.0000 mg | ORAL_TABLET | Freq: Four times a day (QID) | ORAL | Status: DC | PRN

## 2021-05-05 MED ORDER — ACETAMINOPHEN 325 MG PO TABS: 650.0000 mg | ORAL_TABLET | Freq: Four times a day (QID) | ORAL | Status: DC | PRN

## 2021-05-05 MED ORDER — SENNOSIDES-DOCUSATE SODIUM 8.6-50 MG PO TABS: 1.0000 | ORAL_TABLET | Freq: Every evening | ORAL | Status: DC | PRN

## 2021-05-05 MED ORDER — LEVETIRACETAM IN NACL 1500 MG/100ML IV SOLN
1500.0000 mg | Freq: Once | INTRAVENOUS | Status: AC
  Administered 2021-05-05: 1500 mg via INTRAVENOUS
  Filled 2021-05-05: qty 100

## 2021-05-05 MED ORDER — LORAZEPAM 2 MG/ML IJ SOLN
INTRAMUSCULAR | Status: AC
  Administered 2021-05-05: 2 mg via INTRAVENOUS
  Filled 2021-05-05: qty 1

## 2021-05-05 MED ORDER — ONDANSETRON HCL 4 MG/2ML IJ SOLN: 4.0000 mg | Freq: Four times a day (QID) | INTRAMUSCULAR | Status: DC | PRN

## 2021-05-05 MED ORDER — LORAZEPAM 2 MG/ML IJ SOLN: 2.0000 mg | Freq: Once | INTRAMUSCULAR | Status: AC

## 2021-05-05 MED ORDER — LORAZEPAM 1 MG PO TABS: 1.0000 mg | ORAL_TABLET | ORAL | Status: DC | PRN

## 2021-05-05 NOTE — Hospital Course (Addendum)
Soreness around testicles and left leg Denies any cold and cough symptoms. Endorse pain all over.       Irene Limbo [phone]    Drank half of a fifth last night with his brother.   X

## 2021-05-05 NOTE — ED Provider Notes (Signed)
[facility] EMERGENCY DEPARTMENT Provider Note   CSN: [id number] Arrival date & time: 05/05/21  1112     History Chief Complaint  Patient presents with   Seizures    Rodney Freeman is a 35 y.o. male with a past medical history of seizures who was brought in by EMS for seizure.  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  EMS reports that [facility] coworker states he had fallen to the floor and had generalized tonic-clonic movements for 30 minutes.  EMS doubts that this was the case however has reported what was said.  Patient upon their arrival was combative, banging his head on the floor, intermittently falling back asleep and then sitting up and being combative again.  He was given 5 mg of Versed prior to arrival.  EMS reports that the patient smells slightly of EtOH.  Upon arrival patient is awakening.  He reports that he takes Lamictal and has been compliant with medications.  He also states that he drinks moderately and drink alcohol last night.  Patient did not have loss of bowel or bladder control   Seizures     History reviewed. No pertinent past medical history.  There are no problems to display for this patient.   Past Surgical History:  Procedure Laterality Date   ELBOW SURGERY Right        History reviewed. No pertinent family history.  Social History   Tobacco Use   Smoking status: Every Day    Packs/day: 1.00    Types: Cigarettes   Smokeless tobacco: Never  Substance Use Topics   Alcohol use: Yes   Drug use: Yes    Types: Marijuana    Home Medications Prior to Admission medications   Medication Sig Start Date End Date Taking? Authorizing Provider  cyclobenzaprine (FLEXERIL) 5 MG tablet Take 1 tablet (5 mg total) by mouth 3 (three) times daily as needed. 02/22/17   Little, Traci M, PA-C  escitalopram (LEXAPRO) 10 MG tablet Take 10 mg by mouth daily. 11/12/20   [provider]  HYDROcodone-acetaminophen  (NORCO/VICODIN) 5-325 MG tablet Take 1 tablet by mouth every 6 (six) hours as needed for moderate pain. 02/22/17   Little, Traci M, PA-C  lamoTRIgine (LAMICTAL) 200 MG tablet Take 200 mg by mouth 2 (two) times daily. 04/04/21   [provider]  naproxen (NAPROSYN) 500 MG tablet Take 1 tablet (500 mg total) by mouth 2 (two) times daily with a meal. 11/29/15   Hagler, Jami L, PA-C  promethazine (PHENERGAN) 12.5 MG tablet Take 1 tablet (12.5 mg total) by mouth every 6 (six) hours as needed for nausea or vomiting. 02/08/16   Charlynne Pander, MD    Allergies    Patient has no known allergies.  Review of Systems   Review of Systems  Neurological:  Positive for seizures.   Physical Exam Updated Vital Signs BP 110/78   Pulse (!) 58   Temp 97.6 F (36.4 C) (Oral)   Resp 12   Ht 5\' 7"  (1.702 m)   Wt 68 kg   SpO2 98%   BMI 23.49 kg/m   Physical Exam Vitals and nursing note reviewed.  Constitutional:      General: He is not in acute distress.    Appearance: He is well-developed. He is not diaphoretic.  HENT:     Head: Normocephalic and atraumatic.  Eyes:     General: No scleral icterus.  Extraocular Movements: Extraocular movements intact.     Conjunctiva/sclera: Conjunctivae normal.     Pupils: Pupils are equal, round, and reactive to light.  Cardiovascular:     Rate and Rhythm: Normal rate and regular rhythm.     Heart sounds: Normal heart sounds.  Pulmonary:     Effort: Pulmonary effort is normal. No respiratory distress.     Breath sounds: Normal breath sounds.  Abdominal:     Palpations: Abdomen is soft.     Tenderness: There is no abdominal tenderness.  Musculoskeletal:     Cervical back: Normal range of motion and neck supple.  Skin:    General: Skin is warm and dry.  Neurological:     Mental Status: He is lethargic.  Psychiatric:        Behavior: Behavior normal.    ED Results / Procedures / Treatments   Labs (all labs ordered are listed, but only  abnormal results are displayed) Labs Reviewed  CBC WITH DIFFERENTIAL/PLATELET - Abnormal; Notable for the following components:      Result Value   RDW 11.1 (*)    All other components within normal limits  ETHANOL - Abnormal; Notable for the following components:   Alcohol, Ethyl (B) 181 (*)    All other components within normal limits  RAPID URINE DRUG SCREEN, HOSP PERFORMED - Abnormal; Notable for the following components:   Benzodiazepines POSITIVE (*)    Tetrahydrocannabinol POSITIVE (*)    All other components within normal limits  URINALYSIS, ROUTINE W REFLEX MICROSCOPIC - Abnormal; Notable for the following components:   Color, Urine COLORLESS (*)    Specific Gravity, Urine 1.002 (*)    All other components within normal limits  RESP PANEL BY RT-PCR (FLU A&B, COVID) [id number]  COMPREHENSIVE METABOLIC PANEL  MAGNESIUM  LAMOTRIGINE LEVEL  CBG MONITORING, ED    EKG EKG Interpretation  Date/Time:  Wednesday May 05 2021 11:38:17 EST Ventricular Rate:  69 PR Interval:  181 QRS Duration: 111 QT Interval:  393 QTC Calculation: 421 R Axis:   99 Text Interpretation: Sinus rhythm Borderline right axis deviation ST elev, probable normal early repol pattern Confirmed by Cathren Laine ([id number]) on 05/05/2021 11:55:55 AM  Radiology CT HEAD WO CONTRAST  Result Date: 05/05/2021 CLINICAL DATA:  Seizure, acute.  History of trauma. EXAM: CT HEAD WITHOUT CONTRAST TECHNIQUE: Contiguous axial images were obtained from the base of the skull through the vertex without intravenous contrast. COMPARISON:  CT head 09/15/2016. FINDINGS: Study was performed with the patient in a decubitus position. Brain: There is no evidence of acute intracranial hemorrhage, mass lesion, brain edema or extra-axial fluid collection. The ventricles and subarachnoid spaces are appropriately sized for age. There is no CT evidence of acute cortical infarction. Vascular:  No hyperdense vessel identified. Skull: Negative  for fracture or focal lesion. Sinuses/Orbits: New diffuse mucosal thickening and partial opacification of the maxillary, ethmoid and frontal sinuses bilaterally without definite air-fluid levels. The mastoid air cells and middle ears are clear. No orbital abnormalities are seen. Other: None. IMPRESSION: 1. New diffuse paranasal sinus mucosal thickening and partial opacification consistent with sinusitis. 2. No acute intracranial findings. Electronically Signed   By: Carey Bullocks M.D.   On: 05/05/2021 12:46    Procedures .Critical Care Performed by: Arthor Captain, PA-C Authorized by: Arthor Captain, PA-C   Critical care provider statement:    Critical care time (minutes):  52   Critical care time was exclusive of:  Separately billable procedures and treating other  patients   Critical care was necessary to treat or prevent imminent or life-threatening deterioration of the following conditions:  CNS failure or compromise   Critical care was time spent personally by me on the following activities:  Development of treatment plan with patient or surrogate, discussions with consultants, evaluation of patient's response to treatment, examination of patient, ordering and review of laboratory studies, ordering and review of radiographic studies, ordering and performing treatments and interventions, pulse oximetry, re-evaluation of patient's condition and review of old charts   Medications Ordered in ED Medications  LORazepam (ATIVAN) injection 2 mg (2 mg Intravenous Given 05/05/21 1127)  thiamine (B-1) injection 100 mg (100 mg Intravenous Given 05/05/21 1207)  levETIRAcetam (KEPPRA) IVPB 1500 mg/ 100 mL premix (0 mg Intravenous Stopped 05/05/21 1201)    ED Course  I have reviewed the triage vital signs and the nursing notes.  Pertinent labs & imaging results that were available during my care of the patient were reviewed by me and considered in my medical decision making (see chart for  details).  Clinical Course as of 05/05/21 1432  Wed May 05, 2021  1130 Seizing again- lasted approx 5 sec. Ativan given 2 mg  [AH]  1144 Case discussed with  Dr. Selina Cooley- recommends Keppra load [AH]    Clinical Course User Index [AH] Arthor Captain, PA-C   MDM Rules/Calculators/A&P                          Final Clinical Impression(s) / ED Diagnoses Final diagnoses:  Seizure [facility])  Alcoholic intoxication with complication [facility])  35 year old male who presents emergency department by EMS for seizure. The differential diagnosis for includes but is not limited to idiopathic seizure, traumatic brain injury, intracranial hemorrhage, vascular lesion, mass or space containing lesion, degenerative neurologic disease, congenital brain abnormality, infectious etiology such as meningitis, encephalitis or abscess, metabolic disturbance including hyper or hypoglycemia, hyper or hyponatremia, hyperosmolar state, uremia, hepatic failure, hypocalcemia, hypomagnesemia.  Toxic substances such as cocaine, lidocaine, antidepressants, theophylline, alcohol withdrawal, drug withdrawal, eclampsia, hypertensive encephalopathy and anoxic brain injury. I ordered and reviewed labs which included a CBC CMP and urinalysis all of which showed no acute abnormalities, respiratory panel is negative for COVID.  Mag level within normal limits.  Patient's UDS is positive for benzodiazepines and marijuana.  His ethanol level is 181. I reviewed and interpreted images of a CT head which showed no acute abnormalities.  EKG shows sinus rhythm at a rate of 69.  Patient required multiple doses of benzodiazepine and a Keppra loading dose.  He has not yet returned to baseline mental status.  This is likely multifactorial given multiple doses of benzodiazepines and alcohol on board.  Patient may be in withdrawal however is difficult to tell at this time although more likely patient has baseline seizures and has lowered his threshold with  alcohol on board.  I have discussed the case with Dr.Amponsah of the internal medicine teaching service who will admit the patient.  Rx / DC Orders ED Discharge Orders     None        Arthor Captain, PA-C 05/05/21 1434    Cathren Laine, MD 05/05/21 1526

## 2021-05-05 NOTE — ED Notes (Signed)
Patient transported to CT 

## 2021-05-05 NOTE — ED Notes (Signed)
Admitting paged to Charge RN regarding bed request 

## 2021-05-05 NOTE — ED Notes (Signed)
Provider was at the bedside when pt began seizing,this RN administered 2mg  ativan, pt became calm and tearful, pt is sleeping at this time

## 2021-05-05 NOTE — Progress Notes (Signed)
Patient discharged AMA. Discharge instructions reviewed with patient. Patient verbalizes understanding. IV and tele already discontinued per orders. Belongings packed up by patient. Denies needs at this time. Patient off unit via ambulation.

## 2021-05-05 NOTE — ED Triage Notes (Signed)
Pt BIB EMS from work after a witnessed generalized seizure that lasted for ., pt was combative when EMS arrived, upon arrival to his ED room pt had another seizure he was rigid, face went red/purple, pt has hx of seizures

## 2021-05-05 NOTE — Progress Notes (Signed)
Patient arrived on the floor and immediately request to leave AMA.  Requested he stay at least until MD could speak with him,  He agree but was firm in his conviction that staying would not help him. Doctors came to bedside and spoke with patient.  Patient still wishing to leave.  Called for a ride from a friend. IV removed per patient request.  AMA form signed, refused medications.  Came to desk and asked for note for work.

## 2021-05-05 NOTE — Discharge Summary (Addendum)
Name: Rodney Freeman MRN: [id number] DOB: Oct 19, 1985 35 y.o. PCP: [facility]  Date of Admission: 05/05/2021 11:12 AM Date of Discharge: 05/05/21 Attending Physician: Gust Rung, DO   Patient was discharged Against Medical Advice  Discharge Diagnosis: 1. Generalized tonic clonic seizures in the setting of alcohol intoxication 2. Polysubstance use 3. Sinusitis 4. Anxiety   Discharge Medications: Allergies as of 05/05/2021   No Known Allergies      Medication List     STOP taking these medications    HYDROcodone-acetaminophen 5-325 MG tablet Commonly known as: NORCO/VICODIN       TAKE these medications    cyclobenzaprine 5 MG tablet Commonly known as: FLEXERIL Take 1 tablet (5 mg total) by mouth 3 (three) times daily as needed.   escitalopram 10 MG tablet Commonly known as: LEXAPRO Take 10 mg by mouth daily.   lamoTRIgine 200 MG tablet Commonly known as: LAMICTAL Take 200 mg by mouth 2 (two) times daily.   naproxen 500 MG tablet Commonly known as: Naprosyn Take 1 tablet (500 mg total) by mouth 2 (two) times daily with a meal.   promethazine 12.5 MG tablet Commonly known as: PHENERGAN Take 1 tablet (12.5 mg total) by mouth every 6 (six) hours as needed for nausea or vomiting.        Disposition and follow-up:   Mr.Rodney Freeman was discharged from [facility] in Stable condition.  At the hospital follow up visit please address:  1.  Seizures- patient had multiple GTC seizures in the setting of acute alcohol intoxication. Encourage alcohol cessation  2. Alcohol use disorder- patient reports heavy drinking and was intoxicated with a blood alcohol level of 180 at about noon from drinking the night prior.   3.  Labs / imaging needed at time of follow-up: cbc, bmp  4.  Pending labs/ test needing follow-up: lamotrigine level  Follow-up Appointments:   Hospital Course by problem list: 1. Generalized tonic  clonic seizures; acute alcohol intoxication Patient had an out of hospital witnessed seizure followed by another two witnessed seizure when in the ED. Takes lamictal 200 BID. These seizures are in the setting of a blood alcohol level of 181. Elevated alcohol levels may have lowered his seizure threshold though it is possible he missed a dose of his lamotrigine or that was actually having alcohol withdrawal seizures. He received ativan and keppra for his seizures. At about 7 PM he was awake, alert, and oriented x 3 and wanted to leave the hospital. He said that he understood the risks of leaving that he could have other seizure and that he could be seriously injured or even die if there was nobody around to take care of him. He says that he has been through all of this before and nobody was able to find the cause of the seizures he was having. He just really wanted to go home to his own bed and he decided to leave AMA.  2. Polysubstance use Discussed with patient that cessation from alcohol would benefit him as alcohol would make him more predisposed to having seizures. Patient said he understood but was not very open to substance use counseling. Also vapes and smokes marijuana  3. Sinusitis Seen on CT, patient asymptomatic  4. Anxiety Required no intervention, takes citalopram at home.  Discharge Exam:   BP 117/83 (BP Location: Right Arm)   Pulse 63   Temp 97.6 F (36.4 C) (Oral)   Resp 15   Ht  5\' 7"  (1.702 m)   Wt 68 kg   SpO2 95%   BMI 23.49 kg/m  Discharge exam:  Gen: WNWD male in NAD HEENT: MMM Resp: speaking in full sentences, normal WOB Neuro: Alert, oriented, answering questions appropriately Psych: has capacity  Pertinent Labs, Studies, and Procedures:  CT HEAD WO CONTRAST  Result Date: 05/05/2021 CLINICAL DATA:  Seizure, acute.  History of trauma. EXAM: CT HEAD WITHOUT CONTRAST TECHNIQUE: Contiguous axial images were obtained from the base of the skull through the vertex  without intravenous contrast. COMPARISON:  CT head 09/15/2016. FINDINGS: Study was performed with the patient in a decubitus position. Brain: There is no evidence of acute intracranial hemorrhage, mass lesion, brain edema or extra-axial fluid collection. The ventricles and subarachnoid spaces are appropriately sized for age. There is no CT evidence of acute cortical infarction. Vascular:  No hyperdense vessel identified. Skull: Negative for fracture or focal lesion. Sinuses/Orbits: New diffuse mucosal thickening and partial opacification of the maxillary, ethmoid and frontal sinuses bilaterally without definite air-fluid levels. The mastoid air cells and middle ears are clear. No orbital abnormalities are seen. Other: None. IMPRESSION: 1. New diffuse paranasal sinus mucosal thickening and partial opacification consistent with sinusitis. 2. No acute intracranial findings. Electronically Signed   By: Richardean Sale M.D.   On: 05/05/2021 12:46   CT CERVICAL SPINE WO CONTRAST  Result Date: 05/05/2021 CLINICAL DATA:  Neck pain, acute, no red flags Neck trauma, dangerous injury mechanism (Age 38-64y). Seizure EXAM: CT CERVICAL SPINE WITHOUT CONTRAST TECHNIQUE: Multidetector CT imaging of the cervical spine was performed without intravenous contrast. Multiplanar CT image reconstructions were also generated. COMPARISON:  None. FINDINGS: Alignment: Normal. Skull base and vertebrae: No acute fracture. No aggressive appearing focal osseous lesion or focal pathologic process. Soft tissues and spinal canal: No prevertebral fluid or swelling. No visible canal hematoma. Upper chest: Unremarkable. Other: None. IMPRESSION: No acute displaced fracture or traumatic listhesis of the cervical spine. Electronically Signed   By: Iven Finn M.D.   On: 05/05/2021 17:12     CBC Latest Ref Rng & Units 05/05/2021 09/15/2016 02/08/2016  WBC 4.0 - 10.5 K/uL 5.2 12.7(H) 7.7  Hemoglobin 13.0 - 17.0 g/dL 15.0 15.6 15.7  Hematocrit 39.0  - 52.0 % 44.5 45.1 44.8  Platelets 150 - 400 K/uL 374 307 277    BMP Latest Ref Rng & Units 05/05/2021 09/15/2016 02/08/2016  Glucose 70 - 99 mg/dL 82 111(H) 98  BUN 6 - 20 mg/dL 8 16 16   Creatinine 0.61 - 1.24 mg/dL 0.86 0.88 0.84  Sodium 135 - 145 mmol/L 139 140 140  Potassium 3.5 - 5.1 mmol/L 3.6 4.0 4.1  Chloride 98 - 111 mmol/L 105 106 106  CO2 22 - 32 mmol/L 24 25 27   Calcium 8.9 - 10.3 mg/dL 8.9 9.8 9.6     Discharge Instructions: Marchia Bond  You were seen at [facility] for seizures. We recommended that you stay for further care but you decided to leave against medical advice.   If you have further seizures, trouble breathing, confusion, or loss of consciousness we recommend that you see further medical care.  Signed: Scarlett Presto, MD 05/05/2021, 7:00 PM   Pager: [phone]

## 2021-05-05 NOTE — Discharge Instructions (Signed)
Rodney Freeman  You were seen at [facility] for seizures. We recommended that you stay for further care but you decided to leave against medical advice.   If you have further seizures, trouble breathing, confusion, or loss of consciousness we recommend that you see further medical care.

## 2021-05-05 NOTE — ED Notes (Signed)
Upon the patient's arrival to the room, this EMT began connecting the patient to monitoring. After Abigail-PA conducted her initial assessment of the patient, this EMT was still getting the patient situated in the room. The patient told this EMT that he had to urinate and then became rigid and began having seizure activity. The patient's skin became red, almost purple during the activity. The patient had small jerking motions which lasted for approximately 10-15 seconds. This EMT called for assistance while maintaining cervical spine precautions. Assistance arrived at the end of the seizure activity. Mertha Finders was made aware of the situation upon her arrival after transporting another patient to CT.

## 2021-05-05 NOTE — H&P (Signed)
Date: 05/05/2021               Patient Name:  Rodney Freeman MRN: [id number]  DOB: Sep 09, 1985 Age / Sex: 35 y.o., male   PCP: Patient, No Pcp Per (Inactive)         Medical Service: Internal Medicine Teaching Service         Attending Physician: Dr. Cathren Laine, MD    First Contact: Ilene Qua, MD Pager: AD [phone]  Second Contact: Sharrell Ku, MD Pager: PA [phone]       After Hours (After 5p/  First Contact Pager: [phone]  weekends / holidays): Second Contact Pager: [phone]   SUBJECTIVE   Chief Complaint: Seizure  History of Present Illness:  Rodney Freeman is a 35 y.o. with a PMH of seizures on AEDs who is coming in from work via EMS after a witnessed generalized tonic clonic seizure. After arriving via EMS patient had another witnessed seizure. On interview the patient was very drowsy but did wake up with prompting and was able to converse normally. He said that he had soreness everywhere and felt like he got hit by a truck. The last thing he remembers from this morning was his alarm going off. He was drinking with his brother last night and they split about a half of a pint of liquor. Nothing out of the ordinary has happened lately and he has been in his normal state of health prior to this episode. His last seizure was in September. Has app on phone that reminds he to take his meds twice a day and he took his epilepsy med this morning. Patient says that every time he gets admitted the doctors can't find any reason that he is having seizures and all the tests come back normal. He is really frustrated with the whole situation.  While in the room at the end of the interview the patient started to have another ictal episode lasting 10-15 seconds that terminated spontaneously. The patient was post-ictal afterwards but able to protect his airway. He was given ativan at bedside.    Meds:  No outpatient medications have been marked as taking for the 05/05/21 encounter [facility]  Encounter).   No current facility-administered medications on file prior to encounter.   Current Outpatient Medications on File Prior to Encounter  Medication Sig Dispense Refill   cyclobenzaprine (FLEXERIL) 5 MG tablet Take 1 tablet (5 mg total) by mouth 3 (three) times daily as needed. 20 tablet 0   escitalopram (LEXAPRO) 10 MG tablet Take 10 mg by mouth daily.     HYDROcodone-acetaminophen (NORCO/VICODIN) 5-325 MG tablet Take 1 tablet by mouth every 6 (six) hours as needed for moderate pain. 6 tablet 0   lamoTRIgine (LAMICTAL) 200 MG tablet Take 200 mg by mouth 2 (two) times daily.     naproxen (NAPROSYN) 500 MG tablet Take 1 tablet (500 mg total) by mouth 2 (two) times daily with a meal. 14 tablet 0   promethazine (PHENERGAN) 12.5 MG tablet Take 1 tablet (12.5 mg total) by mouth every 6 (six) hours as needed for nausea or vomiting. 10 tablet 0     History reviewed. No pertinent past medical history.  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 Works 3 jobs.  Support: mother PCP PCP Lorenda Cahill at [facility]. Has not seen her in a long time.  Substances: Vaping for over a year. Smoked 3/4th of cig for 10 years. Drinks ETOH moderately. 8 beers, maybe 3-4 shots a couple of times a week. Never had a history of withdrawals. Smokes marijuana once a day  Family History:  Great aunt had hx of seizures  Allergies: Allergies as of 05/05/2021   (No Known Allergies)    Review of Systems: A complete ROS was negative except as per HPI.   OBJECTIVE:   Physical Exam: Blood pressure 104/86, pulse (!) 55, temperature 97.6 F (36.4 C), temperature source Oral, resp. rate 12, height 5\' 7"  (1.702 m), weight 68 kg, SpO2 100 %.   Exam limited 2/2 to ictal event during interview  Constitutional: tired appearing male wearing C collar Eyes:  conjunctiva non-erythematous, possible lateral nystagmus  Cardiovascular: regular rate and rhythm, no m/r/g Pulmonary/Chest: normal work of breathing on room air, lungs clear to auscultation bilaterally Abdominal: soft, non-tender, non-distended MSK: normal bulk and tone Neurological: drowsy, oriented x 3, 5/5 strength on grip and plantar/dorsiflexion. Further strength testing limited 2/2 to pain. Following commands. Cranial nerves 2-12 grossly intact Skin: warm and dry Psych: teary affect  Labs: CBC    Component Value Date/Time   WBC 5.2 05/05/2021 1145   RBC 5.01 05/05/2021 1145   HGB 15.0 05/05/2021 1145   HCT 44.5 05/05/2021 1145   PLT 374 05/05/2021 1145   MCV 88.8 05/05/2021 1145   MCH 29.9 05/05/2021 1145   MCHC 33.7 05/05/2021 1145   RDW 11.1 (L) 05/05/2021 1145   LYMPHSABS 2.3 05/05/2021 1145   MONOABS 0.5 05/05/2021 1145   EOSABS 0.1 05/05/2021 1145   BASOSABS 0.0 05/05/2021 1145     CMP     Component Value Date/Time   NA 139 05/05/2021 1145   K 3.6 05/05/2021 1145   CL 105 05/05/2021 1145   CO2 24 05/05/2021 1145   GLUCOSE 82 05/05/2021 1145   BUN 8 05/05/2021 1145   CREATININE 0.86 05/05/2021 1145   CALCIUM 8.9 05/05/2021 1145   PROT 6.9 05/05/2021 1145   ALBUMIN 4.0 05/05/2021 1145   AST 21 05/05/2021 1145   ALT 13 05/05/2021 1145   ALKPHOS 75 05/05/2021 1145   BILITOT 0.5 05/05/2021 1145   GFRNONAA >60 05/05/2021 1145   GFRAA >60 09/15/2016 0645    Imaging: CT HEAD WO CONTRAST  Result Date: 05/05/2021 CLINICAL DATA:  Seizure, acute.  History of trauma. EXAM: CT HEAD WITHOUT CONTRAST TECHNIQUE: Contiguous axial images were obtained from the base of the skull through the vertex without intravenous contrast. COMPARISON:  CT head 09/15/2016. FINDINGS: Study was performed with the patient in a decubitus position. Brain: There is no evidence of acute intracranial hemorrhage, mass lesion, brain edema or extra-axial fluid collection. The ventricles and  subarachnoid spaces are appropriately sized for age. There is no CT evidence of acute cortical infarction. Vascular:  No hyperdense vessel identified. Skull: Negative for fracture or focal lesion. Sinuses/Orbits: New diffuse mucosal thickening and partial opacification of the maxillary, ethmoid and frontal sinuses bilaterally without definite air-fluid levels. The mastoid air cells and middle ears are clear. No orbital abnormalities are seen. Other: None. IMPRESSION: 1. New diffuse paranasal sinus mucosal thickening and partial opacification consistent with sinusitis. 2. No acute intracranial findings. Electronically Signed   By: 11/15/2016 M.D.   On: 05/05/2021 12:46    EKG: personally reviewed my interpretation is sinus rhythm   ASSESSMENT & PLAN:    Assessment & Plan by Problem: Active Problems:   *  No active hospital problems. *   Grier Vu is a 35 y.o. with pertinent PMH of seizures on lamictal who presented with acute alcohol intoxication and was admitted for a witnessed seizure on hospital day 0  #Multiple witnessed seizures in the setting of acute alcohol intoxication #history of seizures Patient had an out of hospital witnessed seizure followed by another two witnessed seizure when in the ED. Per chart review patient's first seizure was in April 2018, no provoking factors identified, started AED in November 2018. MRI at that time showed no acute intracranial abnormality. Was switched to lamictal due tremor, irritalbility, and decreased appetite with depakote. Currently takes lamictal 200 BID.These seizures are in the setting of a blood alcohol level of 181. Elevated alcohol levels may have lowered his seizure threshold though it is possible he missed a dose of his lamotrigine or that he is actually having alcohol withdrawal seizures.  - recommend abstinence from alcohol - neurology consulted appreciate assistance - f/u EEG - seizure precautions - 100 mL/hr LR - NPO pending  bedside swallow - f/u lamictal level - daily bmp - CIWA with ativan - NSAIDs for pain  #Polysubstance use Patient with elevated alcohol levels as well as THC on UDS. Also smokes cigarettes. Given his history of seizures it will be important to continue discussing him stopping drinking alcohol.  - cessation counseling - CIWA with ativan - thiamine  #Sinusitis Seen on head CTM - conservative symptom management  #Anxiety disorder Reported episodes of anxiety/panic  -Home citalopram 10 continued  Diet: NPO VTE: Enoxaparin IVF: LR,100cc/hr Code: Full  Prior to Admission Living Arrangement: Home, living alone Anticipated Discharge Location: Home Barriers to Discharge: continued medical treatment  Dispo: Admit patient to Inpatient with expected length of stay greater than 2 midnights.  Signed: Ilene Qua, MD Internal Medicine Resident PGY-1 Pager: [phone]  05/05/2021, 2:39 PM

## 2021-05-05 NOTE — Progress Notes (Signed)
EEG done at bedside on sleepy patient. No skin breakdown noted. Impedance= good. Results pending.

## 2021-05-06 NOTE — Procedures (Signed)
Patient Name: Rodney Freeman  MRN: [id number]  Epilepsy Attending: Charlsie Quest  Referring Physician/Provider: Dr Carlynn Purl Date: 05/06/2021  Duration: 20.46 mins  Patient history: 35 year old male with multiple witnessed seizures in the setting of alcohol intoxication.  EEG evaluate for seizures.  Level of alertness: Awake, asleep  AEDs during EEG study: None  Technical aspects: This EEG study was done with scalp electrodes positioned according to the 10-20 International system of electrode placement. Electrical activity was acquired at a sampling rate of 500Hz  and reviewed with a high frequency filter of 70Hz  and a low frequency filter of 1Hz . EEG data were recorded continuously and digitally stored.   Description: The posterior dominant rhythm consists of 9 Hz activity of moderate voltage (25-35 uV) seen predominantly in posterior head regions, symmetric and reactive to eye opening and eye closing. Sleep was characterized by vertex waves, sleep spindles (12 to 14 Hz), maximal frontocentral region. Hyperventilation and photic stimulation were not performed.     IMPRESSION: This study is within normal limits. No seizures or epileptiform discharges were seen throughout the recording.  Jolinda Pinkstaff 

## 2021-05-07 LAB — LAMOTRIGINE LEVEL: Lamotrigine Lvl: 8.1 ug/mL (ref 2.0–20.0)

## 2021-09-18 ENCOUNTER — Emergency Department (HOSPITAL_COMMUNITY): Payer: Medicaid Other

## 2021-09-18 ENCOUNTER — Encounter (HOSPITAL_COMMUNITY): Payer: Self-pay

## 2021-09-18 ENCOUNTER — Inpatient Hospital Stay (HOSPITAL_COMMUNITY)
Admission: EM | Admit: 2021-09-18 | Discharge: 2021-09-19 | DRG: 101 | Disposition: A | Discharge status: Home / Self Care | Payer: Medicaid Other | Attending: Internal Medicine | Admitting: Internal Medicine

## 2021-09-18 DIAGNOSIS — Z79899 Other long term (current) drug therapy: Secondary | ICD-10-CM | POA: Diagnosis not present

## 2021-09-18 DIAGNOSIS — F32A Depression, unspecified: Secondary | ICD-10-CM | POA: Diagnosis present

## 2021-09-18 DIAGNOSIS — F10129 Alcohol abuse with intoxication, unspecified: Secondary | ICD-10-CM | POA: Diagnosis present

## 2021-09-18 DIAGNOSIS — F419 Anxiety disorder, unspecified: Secondary | ICD-10-CM | POA: Diagnosis present

## 2021-09-18 DIAGNOSIS — F10929 Alcohol use, unspecified with intoxication, unspecified: Secondary | ICD-10-CM | POA: Diagnosis present

## 2021-09-18 DIAGNOSIS — F10921 Alcohol use, unspecified with intoxication delirium: Secondary | ICD-10-CM

## 2021-09-18 DIAGNOSIS — Y92410 Unspecified street and highway as the place of occurrence of the external cause: Secondary | ICD-10-CM | POA: Diagnosis not present

## 2021-09-18 DIAGNOSIS — F17210 Nicotine dependence, cigarettes, uncomplicated: Secondary | ICD-10-CM | POA: Diagnosis present

## 2021-09-18 DIAGNOSIS — G40909 Epilepsy, unspecified, not intractable, without status epilepticus: Principal | ICD-10-CM | POA: Diagnosis present

## 2021-09-18 DIAGNOSIS — R569 Unspecified convulsions: Secondary | ICD-10-CM

## 2021-09-18 DIAGNOSIS — Y907 Blood alcohol level of 200-239 mg/100 ml: Secondary | ICD-10-CM | POA: Diagnosis present

## 2021-09-18 LAB — I-STAT CHEM 8, ED
BUN: 4 mg/dL — ABNORMAL LOW (ref 6–20)
Calcium, Ion: 1.11 mmol/L — ABNORMAL LOW (ref 1.15–1.40)
Chloride: 102 mmol/L (ref 98–111)
Creatinine, Ser: 1.2 mg/dL (ref 0.61–1.24)
Glucose, Bld: 77 mg/dL (ref 70–99)
HCT: 41 % (ref 39.0–52.0)
Hemoglobin: 13.9 g/dL (ref 13.0–17.0)
Potassium: 3.5 mmol/L (ref 3.5–5.1)
Sodium: 141 mmol/L (ref 135–145)
TCO2: 27 mmol/L (ref 22–32)

## 2021-09-18 LAB — COMPREHENSIVE METABOLIC PANEL
ALT: 16 U/L (ref 0–44)
AST: 24 U/L (ref 15–41)
Albumin: 3.9 g/dL (ref 3.5–5.0)
Alkaline Phosphatase: 57 U/L (ref 38–126)
Anion gap: 9 (ref 5–15)
BUN: <5 mg/dL — ABNORMAL LOW (ref 6–20)
CO2: 25 mmol/L (ref 22–32)
Calcium: 8.5 mg/dL — ABNORMAL LOW (ref 8.9–10.3)
Chloride: 106 mmol/L (ref 98–111)
Creatinine, Ser: 0.89 mg/dL (ref 0.61–1.24)
GFR, Estimated: >60 mL/min (ref >60)
Glucose, Bld: 80 mg/dL (ref 70–99)
Potassium: 3.6 mmol/L (ref 3.5–5.1)
Sodium: 140 mmol/L (ref 135–145)
Total Bilirubin: 0.6 mg/dL (ref 0.3–1.2)
Total Protein: 5.9 g/dL — ABNORMAL LOW (ref 6.5–8.1)

## 2021-09-18 LAB — PROTIME-INR
INR: 0.9 (ref 0.8–1.2)
Prothrombin Time: 12.2 seconds (ref 11.4–15.2)

## 2021-09-18 LAB — CBC
HCT: 41.2 % (ref 39.0–52.0)
Hemoglobin: 14.1 g/dL (ref 13.0–17.0)
MCH: 30.9 pg (ref 26.0–34.0)
MCHC: 34.2 g/dL (ref 30.0–36.0)
MCV: 90.4 fL (ref 80.0–100.0)
Platelets: 242 10*3/uL (ref 150–400)
RBC: 4.56 MIL/uL (ref 4.22–5.81)
RDW: 11.6 % (ref 11.5–15.5)
WBC: 6 10*3/uL (ref 4.0–10.5)
nRBC: 0 % (ref 0.0–0.2)

## 2021-09-18 LAB — LACTIC ACID, PLASMA: Lactic Acid, Venous: 2.3 mmol/L (ref 0.5–1.9)

## 2021-09-18 LAB — ETHANOL: Alcohol, Ethyl (B): 200 mg/dL — ABNORMAL HIGH (ref <10)

## 2021-09-18 MED ORDER — SODIUM CHLORIDE 0.9 % IV SOLN: INTRAVENOUS | Status: DC

## 2021-09-18 MED ORDER — ACETAMINOPHEN 650 MG RE SUPP: 650.0000 mg | Freq: Four times a day (QID) | RECTAL | Status: DC | PRN

## 2021-09-18 MED ORDER — ENOXAPARIN SODIUM 40 MG/0.4ML IJ SOSY
40.0000 mg | PREFILLED_SYRINGE | Freq: Every day | INTRAMUSCULAR | Status: DC
  Administered 2021-09-19: 40 mg via SUBCUTANEOUS
  Filled 2021-09-18: qty 0.4

## 2021-09-18 MED ORDER — CYCLOBENZAPRINE HCL 10 MG PO TABS: 5.0000 mg | ORAL_TABLET | Freq: Three times a day (TID) | ORAL | Status: DC | PRN

## 2021-09-18 MED ORDER — ONDANSETRON HCL 4 MG PO TABS
4.0000 mg | ORAL_TABLET | Freq: Four times a day (QID) | ORAL | Status: DC | PRN
Start: 2021-09-18 — End: 2021-09-19

## 2021-09-18 MED ORDER — MAGNESIUM HYDROXIDE 400 MG/5ML PO SUSP: 30.0000 mL | Freq: Every day | ORAL | Status: DC | PRN

## 2021-09-18 MED ORDER — TRAZODONE HCL 50 MG PO TABS: 25.0000 mg | ORAL_TABLET | Freq: Every evening | ORAL | Status: DC | PRN

## 2021-09-18 MED ORDER — LAMOTRIGINE 100 MG PO TABS
200.0000 mg | ORAL_TABLET | Freq: Two times a day (BID) | ORAL | Status: DC
  Administered 2021-09-19: 200 mg via ORAL
  Filled 2021-09-18 (×3): qty 2

## 2021-09-18 MED ORDER — SODIUM CHLORIDE 0.9 % IV BOLUS
1000.0000 mL | Freq: Once | INTRAVENOUS | Status: AC
  Administered 2021-09-18: 1000 mL via INTRAVENOUS

## 2021-09-18 MED ORDER — ONDANSETRON HCL 4 MG/2ML IJ SOLN: 4.0000 mg | Freq: Four times a day (QID) | INTRAMUSCULAR | Status: DC | PRN

## 2021-09-18 MED ORDER — THIAMINE HCL 100 MG/ML IJ SOLN
Freq: Once | INTRAVENOUS | Status: AC
  Filled 2021-09-18: qty 1000

## 2021-09-18 MED ORDER — STERILE WATER FOR INJECTION IJ SOLN
INTRAMUSCULAR | Status: DC
Start: 2021-09-18 — End: 2021-09-18
  Filled 2021-09-18: qty 10

## 2021-09-18 MED ORDER — LEVETIRACETAM IN NACL 1500 MG/100ML IV SOLN
1500.0000 mg | Freq: Once | INTRAVENOUS | Status: AC
  Administered 2021-09-18: 1500 mg via INTRAVENOUS
  Filled 2021-09-18: qty 100

## 2021-09-18 MED ORDER — IOHEXOL 300 MG/ML  SOLN
100.0000 mL | Freq: Once | INTRAMUSCULAR | Status: AC | PRN
Start: 2021-09-18 — End: 2021-09-18
  Administered 2021-09-18: 100 mL via INTRAVENOUS

## 2021-09-18 MED ORDER — LEVETIRACETAM IN NACL 500 MG/100ML IV SOLN
500.0000 mg | Freq: Two times a day (BID) | INTRAVENOUS | Status: DC
Start: 2021-09-19 — End: 2021-09-19
  Administered 2021-09-19: 500 mg via INTRAVENOUS
  Filled 2021-09-18 (×2): qty 100

## 2021-09-18 MED ORDER — ESCITALOPRAM OXALATE 10 MG PO TABS
5.0000 mg | ORAL_TABLET | Freq: Every day | ORAL | Status: DC
  Administered 2021-09-19: 5 mg via ORAL
  Filled 2021-09-18: qty 1

## 2021-09-18 MED ORDER — ZIPRASIDONE MESYLATE 20 MG IM SOLR
20.0000 mg | Freq: Once | INTRAMUSCULAR | Status: AC
  Administered 2021-09-18: 20 mg via INTRAMUSCULAR
  Filled 2021-09-18: qty 20

## 2021-09-18 MED ORDER — ACETAMINOPHEN 325 MG PO TABS: 650.0000 mg | ORAL_TABLET | Freq: Four times a day (QID) | ORAL | Status: DC | PRN

## 2021-09-18 MED ORDER — LORAZEPAM 2 MG/ML IJ SOLN
INTRAMUSCULAR | Status: AC
  Filled 2021-09-18: qty 1

## 2021-09-18 MED ORDER — LEVETIRACETAM IN NACL 1000 MG/100ML IV SOLN
4000.0000 mg | Freq: Once | INTRAVENOUS | Status: DC
Start: 2021-09-18 — End: 2021-09-18

## 2021-09-18 MED ORDER — LORAZEPAM 2 MG/ML IJ SOLN
1.0000 mg | Freq: Once | INTRAMUSCULAR | Status: AC
  Administered 2021-09-18: 1 mg via INTRAVENOUS
  Filled 2021-09-18: qty 1
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
## 2021-09-18 NOTE — Progress Notes (Addendum)
Trauma Event Note ? ? ?TRN at bedside to round, met patient in CT. Delay in CT arrival d/t combative behavior. Upon arrival, patient sedated with geodon, pupils pinpoint, reactive to light. Responsive to deep painful stimuli, nonverbal at this time. Respirations unlabored, equal chest rise and fall, hooked back up to eTCO2 upon return to room, [place] 4L with nasal trumpet in place left nare. C-collar remains off. Primary RN initiating keppra infusion, Lamictal level for chronic seizure management pending. Dispo currently pending results, mental status improvement. ? ?Last imported Vital Signs ?BP 112/87   Pulse 62   Temp 98.5 ?F (36.9 ?C) (Oral)   Resp 13   Ht 5' 7"  (1.702 m)   Wt 149 lb 14.6 oz (68 kg)   SpO2 100%   BMI 23.48 kg/m?  ? ?Trending CBC ?Recent Labs  ?  09/18/21 ?1817 09/18/21 ?1840  ?WBC 6.0  --   ?HGB 14.1 13.9  ?HCT 41.2 41.0  ?PLT 242  --   ? ? ?Trending Coag's ?Recent Labs  ?  09/18/21 ?1817  ?INR 0.9  ? ? ?Trending BMET ?Recent Labs  ?  09/18/21 ?1817 09/18/21 ?1840  ?NA 140 141  ?K 3.6 3.5  ?CL 106 102  ?CO2 25  --   ?BUN <5* 4*  ?CREATININE 0.89 1.20  ?GLUCOSE 80 77  ? ? ? ? ?[place]  ?Trauma Response RN ? ?Please call TRN at [phone] for further assistance. ? ? ?  ?

## 2021-09-18 NOTE — Progress Notes (Signed)
Orthopedic Tech Progress Note ?Patient Details:  ?Rodney Freeman ?[birth year] ?[id number] ?Level 2 Trauma  ?Patient ID: Rodney Freeman, male   DOB: Sep 23, 1985, 36 y.o.   MRN: [id number] ? ?[name] ?09/18/2021, 6:37 PM ? ?
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
## 2021-09-18 NOTE — ED Notes (Signed)
Pt began shaking and closed eyes. Tensed body and rolled to L side of bed. ?seizure activity, promptly ceased when law enforcement left room. Pt then opened eyes and looked at this RN and said "lady no". MD Freida Busman notified of possible seizurelike activity, self limiting and no post ictal period noted ? ?

## 2021-09-18 NOTE — ED Notes (Signed)
Patient transported to CT 

## 2021-09-18 NOTE — ED Notes (Signed)
Pt growing increasingly combative, refusing to keep c-collar on stating that he has epilepsy and "does not need this shit". Educated as to importance of c-collar and risk of paralysis and further injury. Pt grabbed c-collar in mouth and began chewing on it, and then spit on it. Would not allow this RN to replace. MD made aware of c-collar refusal  ? ?

## 2021-09-18 NOTE — ED Notes (Signed)
Pt chewed out IV in R AC. Pt reports he doesn't need it and has "done this before". Refuses to follow commands. ?

## 2021-09-18 NOTE — ED Notes (Signed)
ED provider told of pt lactic acid 2.3 ?

## 2021-09-18 NOTE — ED Notes (Signed)
Pt remains agitated following sedating medication. Continues to chew at IV access and not follow commands. Attempts to sit up and chew at monitoring equipment.  ?

## 2021-09-19 ENCOUNTER — Ambulatory Visit (HOSPITAL_COMMUNITY): Payer: Medicaid Other

## 2021-09-19 ENCOUNTER — Other Ambulatory Visit: Payer: Self-pay

## 2021-09-19 DIAGNOSIS — F10929 Alcohol use, unspecified with intoxication, unspecified: Secondary | ICD-10-CM | POA: Diagnosis present

## 2021-09-19 DIAGNOSIS — F419 Anxiety disorder, unspecified: Secondary | ICD-10-CM | POA: Diagnosis present

## 2021-09-19 LAB — COMPREHENSIVE METABOLIC PANEL
ALT: 13 U/L (ref 0–44)
AST: 21 U/L (ref 15–41)
Albumin: 3.4 g/dL — ABNORMAL LOW (ref 3.5–5.0)
Alkaline Phosphatase: 50 U/L (ref 38–126)
Anion gap: 6 (ref 5–15)
BUN: 6 mg/dL (ref 6–20)
CO2: 27 mmol/L (ref 22–32)
Calcium: 8.1 mg/dL — ABNORMAL LOW (ref 8.9–10.3)
Chloride: 111 mmol/L (ref 98–111)
Creatinine, Ser: 0.95 mg/dL (ref 0.61–1.24)
GFR, Estimated: >60 mL/min (ref >60)
Glucose, Bld: 78 mg/dL (ref 70–99)
Potassium: 4.1 mmol/L (ref 3.5–5.1)
Sodium: 144 mmol/L (ref 135–145)
Total Bilirubin: 0.6 mg/dL (ref 0.3–1.2)
Total Protein: 5.5 g/dL — ABNORMAL LOW (ref 6.5–8.1)

## 2021-09-19 LAB — CBC
HCT: 39.4 % (ref 39.0–52.0)
Hemoglobin: 13.8 g/dL (ref 13.0–17.0)
MCH: 32.2 pg (ref 26.0–34.0)
MCHC: 35 g/dL (ref 30.0–36.0)
MCV: 92.1 fL (ref 80.0–100.0)
Platelets: 247 10*3/uL (ref 150–400)
RBC: 4.28 MIL/uL (ref 4.22–5.81)
RDW: 11.9 % (ref 11.5–15.5)
WBC: 5.7 10*3/uL (ref 4.0–10.5)
nRBC: 0 % (ref 0.0–0.2)

## 2021-09-19 LAB — URINALYSIS, ROUTINE W REFLEX MICROSCOPIC
Bilirubin Urine: NEGATIVE
Glucose, UA: NEGATIVE mg/dL
Hgb urine dipstick: NEGATIVE
Ketones, ur: NEGATIVE mg/dL
Leukocytes,Ua: NEGATIVE
Nitrite: NEGATIVE
Protein, ur: NEGATIVE mg/dL
Specific Gravity, Urine: 1.019 (ref 1.005–1.030)
pH: 6 (ref 5.0–8.0)

## 2021-09-19 LAB — RAPID URINE DRUG SCREEN, HOSP PERFORMED
Amphetamines: NOT DETECTED
Barbiturates: NOT DETECTED
Benzodiazepines: POSITIVE — AB
Cocaine: NOT DETECTED
Opiates: NOT DETECTED
Tetrahydrocannabinol: POSITIVE — AB

## 2021-09-19 LAB — HIV ANTIBODY (ROUTINE TESTING W REFLEX): HIV Screen 4th Generation wRfx: NONREACTIVE

## 2021-09-19 MED ORDER — FOLIC ACID 1 MG PO TABS: 1.0000 mg | ORAL_TABLET | Freq: Every day | ORAL | 0 refills | Status: DC

## 2021-09-19 MED ORDER — LORAZEPAM 1 MG PO TABS: 1.0000 mg | ORAL_TABLET | ORAL | Status: DC | PRN

## 2021-09-19 MED ORDER — THIAMINE HCL 100 MG PO TABS
100.0000 mg | ORAL_TABLET | Freq: Every day | ORAL | Status: DC
  Administered 2021-09-19: 100 mg via ORAL
  Filled 2021-09-19: qty 1

## 2021-09-19 MED ORDER — THIAMINE HCL 100 MG PO TABS: 100.0000 mg | ORAL_TABLET | Freq: Every day | ORAL | 0 refills | Status: DC

## 2021-09-19 MED ORDER — HALOPERIDOL LACTATE 5 MG/ML IJ SOLN: 2.0000 mg | Freq: Four times a day (QID) | INTRAMUSCULAR | Status: DC | PRN

## 2021-09-19 MED ORDER — SODIUM CHLORIDE 0.9 % IV SOLN: INTRAVENOUS | Status: DC

## 2021-09-19 MED ORDER — LEVETIRACETAM 500 MG PO TABS: 500.0000 mg | ORAL_TABLET | Freq: Two times a day (BID) | ORAL | 0 refills | Status: DC

## 2021-09-19 MED ORDER — FOLIC ACID 1 MG PO TABS
1.0000 mg | ORAL_TABLET | Freq: Every day | ORAL | Status: DC
  Administered 2021-09-19: 1 mg via ORAL
  Filled 2021-09-19: qty 1

## 2021-09-19 MED ORDER — ADULT MULTIVITAMIN W/MINERALS CH
1.0000 | ORAL_TABLET | Freq: Every day | ORAL | Status: DC
  Administered 2021-09-19: 1 via ORAL
  Filled 2021-09-19: qty 1

## 2021-09-19 MED ORDER — THIAMINE HCL 100 MG/ML IJ SOLN: 100.0000 mg | Freq: Every day | INTRAMUSCULAR | Status: DC

## 2021-09-19 MED ORDER — LORAZEPAM 2 MG/ML IJ SOLN: 1.0000 mg | INTRAMUSCULAR | Status: DC | PRN
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
## 2021-09-19 NOTE — Progress Notes (Signed)
EEG complete - results pending 

## 2021-09-19 NOTE — Discharge Instructions (Signed)
Do not drive, operate heavy machinery, perform activities at heights, swimming or participation in water activities or provide baby sitting services until you have seen by  your Neurologist and advised to do so again. ? ?Follow with [facility] in 7 days get Lamictal levels which are still pending checked, also follow with your neurologist within 7 days ? ?Get CBC, CMP, 2 view Chest X ray -  checked next visit within 1 week by Primary MD   ? ?Activity: As tolerated with Full fall precautions use walker/cane & assistance as needed ? ?Disposition Home   ? ?Diet: Heart Healthy  ? ?Special Instructions: If you have smoked or chewed Tobacco  in the last 2 yrs please stop smoking, stop any regular Alcohol  and or any Recreational drug use. ? ?On your next visit with your primary care physician please Get Medicines reviewed and adjusted. ? ?Please request your Prim.MD to go over all Hospital Tests and Procedure/Radiological results at the follow up, please get all Hospital records sent to your Prim MD by signing hospital release before you go home. ? ?If you experience worsening of your admission symptoms, develop shortness of breath, life threatening emergency, suicidal or homicidal thoughts you must seek medical attention immediately by calling 911 or calling your MD immediately  if symptoms less severe. ? ?You Must read complete instructions/literature along with all the possible adverse reactions/side effects for all the Medicines you take and that have been prescribed to you. Take any new Medicines after you have completely understood and accpet all the possible adverse reactions/side effects.  ? ?  ?

## 2021-09-19 NOTE — ED Notes (Signed)
Pt now appears calm, cooperative, alert, and oriented to this RN. Able to appropriately answer all questions, follow commands without issue, and engage in conversation regarding plan of care/admittance to hospital and anticipated EEG.  ?

## 2021-09-19 NOTE — ED Notes (Signed)
Pt provided discharge instructions and prescription information. Pt instructed that he is not able to drive again until cleared by neurology. Pt to f/u with neuro this week. Pt agreeable to discharge conditions. Pt was given the opportunity to ask questions and questions were answered. Discharge signature not obtained in the setting of the COVID-19 pandemic in order to reduce high touch surfaces.  ? ?

## 2021-09-19 NOTE — ED Notes (Signed)
EEG technician at pt bedside ?

## 2021-09-19 NOTE — TOC CAGE-AID Note (Signed)
Transition of Care (TOC) - CAGE-AID Screening ? ? ?Patient Details  ?Name: Rodney Freeman ?MRN: [id number] ?Date of Birth: 1986/02/10 ? ?Transition of Care (TOC) CM/SW Contact:    ?Katha Hamming, RN ?Phone Number:[phone] ?09/19/2021, 1:18 AM ? ? ?Clinical Narrative: ? ?Hx polysubstance abuse, marijuana. ETOH 200 in ED.  ? ?CAGE-AID Screening: ?Substance Abuse Screening unable to be completed due to: : Patient unable to participate (too sedated to participate in screening at this time, however has a history of poly substance abuse and would benefit from consult. TOC consult placed) ? ?  ?  ?  ?  ?  ? ?  ? ?  ? ? ? ? ? ? ?
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
## 2021-09-19 NOTE — Progress Notes (Signed)
Prelim EEG review negative for seizures. ? ?-- ?Antero Derosia, MD ?Neurologist ?[facility] ?Pager: [phone] ? ?

## 2021-09-19 NOTE — ED Notes (Addendum)
This RN contacted MD Candiss Norse in regards to thiamine/folic acid infusion. MD states to continue infusion. New orders requested.  ?
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
## 2021-09-19 NOTE — Assessment & Plan Note (Signed)
-   We will continue Lexapro. 

## 2021-09-19 NOTE — Assessment & Plan Note (Signed)
-   She will be monitored with CIWA protocol. ?- We will place him on a banana bag daily. ?

## 2021-09-19 NOTE — Assessment & Plan Note (Signed)
-   The patient will be admitted to a medical telemetry bed. ?- Seizure precautions will be provided. ?- Neurochecks will be followed. ?- He will be continued on Keppra 500 mg p.o. twice daily till compliance with Lamictal is confirmed. ?- We will check Lamictal level. ?- Routine EEG. ?- Patient not been thriving and that was discussed between Dr. Malen Gauze and him. ?- At some point he he may need EMU admission.  Dr. Malen Gauze to characterize his seizures. ?

## 2021-09-20 LAB — LAMOTRIGINE LEVEL: Lamotrigine Lvl: 8.7 ug/mL (ref 2.0–20.0)

## 2021-12-01 ENCOUNTER — Encounter: Payer: Self-pay | Admitting: Emergency Medicine

## 2021-12-01 ENCOUNTER — Emergency Department: Payer: Self-pay

## 2021-12-01 ENCOUNTER — Other Ambulatory Visit: Payer: Self-pay

## 2021-12-01 ENCOUNTER — Emergency Department
Admission: EM | Admit: 2021-12-01 | Discharge: 2021-12-01 | Disposition: A | Discharge status: Home / Self Care | Payer: Self-pay | Attending: Emergency Medicine | Admitting: Emergency Medicine

## 2021-12-01 DIAGNOSIS — M545 Low back pain, unspecified: Secondary | ICD-10-CM | POA: Insufficient documentation

## 2021-12-01 DIAGNOSIS — Y92410 Unspecified street and highway as the place of occurrence of the external cause: Secondary | ICD-10-CM | POA: Insufficient documentation

## 2021-12-01 DIAGNOSIS — M25562 Pain in left knee: Secondary | ICD-10-CM | POA: Insufficient documentation

## 2021-12-01 DIAGNOSIS — R569 Unspecified convulsions: Secondary | ICD-10-CM | POA: Insufficient documentation

## 2021-12-01 DIAGNOSIS — M25552 Pain in left hip: Secondary | ICD-10-CM | POA: Insufficient documentation

## 2021-12-01 HISTORY — DX: Unspecified convulsions: R56.9

## 2021-12-01 LAB — CBG MONITORING, ED: Glucose-Capillary: 84 mg/dL (ref 70–99)

## 2021-12-01 MED ORDER — IBUPROFEN 400 MG PO TABS
400.0000 mg | ORAL_TABLET | Freq: Once | ORAL | Status: AC
  Administered 2021-12-01: 400 mg via ORAL
  Filled 2021-12-01: qty 1
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
## 2021-12-01 NOTE — ED Notes (Signed)
Pt in bed, pt states that he feels a little bit better when he is resting, pt states that he is ready to go home. Pt verbalized understanding d/c and follow up, pt from dpt
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
## 2021-12-01 NOTE — Discharge Instructions (Signed)
Your CAT scan of your brain did not show any injury.  X-rays of your knee and hip also did not have any broken bones.  Please continue to take your lamotrigine for seizure prophylaxis.

## 2022-05-11 ENCOUNTER — Other Ambulatory Visit: Payer: Self-pay

## 2022-05-11 ENCOUNTER — Emergency Department
Admission: EM | Admit: 2022-05-11 | Discharge: 2022-05-11 | Disposition: A | Discharge status: Home / Self Care | Payer: Self-pay | Attending: Emergency Medicine | Admitting: Emergency Medicine

## 2022-05-11 ENCOUNTER — Emergency Department: Payer: Medicaid Other

## 2022-05-11 ENCOUNTER — Emergency Department: Payer: Self-pay

## 2022-05-11 DIAGNOSIS — R569 Unspecified convulsions: Secondary | ICD-10-CM

## 2022-05-11 DIAGNOSIS — G40909 Epilepsy, unspecified, not intractable, without status epilepticus: Secondary | ICD-10-CM | POA: Insufficient documentation

## 2022-05-11 LAB — CBC
HCT: 43 % (ref 39.0–52.0)
Hemoglobin: 14.7 g/dL (ref 13.0–17.0)
MCH: 30.7 pg (ref 26.0–34.0)
MCHC: 34.2 g/dL (ref 30.0–36.0)
MCV: 89.8 fL (ref 80.0–100.0)
Platelets: 270 10*3/uL (ref 150–400)
RBC: 4.79 MIL/uL (ref 4.22–5.81)
RDW: 11.1 % — ABNORMAL LOW (ref 11.5–15.5)
WBC: 6.9 10*3/uL (ref 4.0–10.5)
nRBC: 0 % (ref 0.0–0.2)

## 2022-05-11 LAB — BASIC METABOLIC PANEL
Anion gap: 12 (ref 5–15)
BUN: 11 mg/dL (ref 6–20)
CO2: 27 mmol/L (ref 22–32)
Calcium: 8.9 mg/dL (ref 8.9–10.3)
Chloride: 107 mmol/L (ref 98–111)
Creatinine, Ser: 0.9 mg/dL (ref 0.61–1.24)
GFR, Estimated: >60 mL/min (ref >60)
Glucose, Bld: 86 mg/dL (ref 70–99)
Potassium: 3.6 mmol/L (ref 3.5–5.1)
Sodium: 146 mmol/L — ABNORMAL HIGH (ref 135–145)

## 2022-05-11 MED ORDER — LAMOTRIGINE 100 MG PO TABS
200.0000 mg | ORAL_TABLET | Freq: Once | ORAL | Status: AC
Start: 2022-05-11 — End: 2022-05-11
  Administered 2022-05-11: 200 mg via ORAL
  Filled 2022-05-11: qty 2

## 2022-05-11 MED ORDER — LORAZEPAM 1 MG PO TABS
1.0000 mg | ORAL_TABLET | Freq: Once | ORAL | Status: AC
Start: 2022-05-11 — End: 2022-05-11
  Administered 2022-05-11: 1 mg via ORAL
  Filled 2022-05-11: qty 1

## 2022-05-11 MED ORDER — CLONAZEPAM 0.5 MG PO TABS
0.5000 mg | ORAL_TABLET | Freq: Once | ORAL | Status: DC
  Filled 2022-05-11: qty 1

## 2022-05-11 MED ORDER — LEVETIRACETAM 500 MG PO TABS
1000.0000 mg | ORAL_TABLET | Freq: Once | ORAL | Status: DC
  Filled 2022-05-11: qty 2

## 2022-05-11 MED ORDER — CLONAZEPAM 0.5 MG PO TABS: 0.5000 mg | ORAL_TABLET | Freq: Two times a day (BID) | ORAL | 0 refills | Status: AC

## 2022-05-11 NOTE — ED Notes (Signed)
RN notified provider that pt is attempting to get out of bed even with security and ED tech at bedside. Provider at bedside.
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
## 2022-05-11 NOTE — ED Notes (Signed)
Pt is currently A/Ox4 with capacity. Pt is able to answer all of RN's questions for time, date, person, place and the reason why pt is here.

## 2022-05-11 NOTE — ED Notes (Addendum)
Pt presents to ED with c/o of multiple seizures. Pt states he was DX'ed with seizures at the age of 89. Pt states he has missed some doses of his seizure medicine "because it doesn't work", pt educated he cannot miss any doses or he will likely have breakthrough seizures. Pt states he last had a drink last night but is not able to tell this RN his normal ETOH pattern, pt states seen recently for the same. Pt denies urinating himself. Pt does state he fell last night and hit his L flank area and pt does have a bruise to this area, pt endorses pain on inspiration. Pt unsure if he hit his head or not. Pt is currently A&Ox4. Pt educated to use call bell for any assistance and if he feels like he may seize again, pt verbalized understanding an states "I wont be here that long".  Seizure pads placed, call bell in reach.

## 2022-05-11 NOTE — ED Notes (Signed)
Pt provided D/C papers. Pt verbalized understanding of d/c papers and medications that are prescribed to him. Pt is A/Ox4 with capacity.  RN assisted pt to lobby via wheel chair. RN notified nurse first, Lawyer, that pt was in the lobby and is waiting/ needs to call for a ride since pt came to ED via EMS.

## 2022-05-11 NOTE — ED Notes (Addendum)
ED tech at bedside. Pt continues to get out of bed. Fall mats placed, fall alarm on, slip socks on, fall risk band in place.

## 2022-05-11 NOTE — ED Notes (Signed)
RN stepped out of pt room to get Rx for pt. RN was notified while at medication machine that pt was on floor and fell on fall mats. Pt A/Ox4 with capacity.

## 2022-05-11 NOTE — ED Notes (Addendum)
After this RN left room, pt got out of bed with monitoring equipment attached as well as pulled his IV out. Pt did fall and states he did not hurt himself, no bruises or swelling noted.   Pt assisted back to bed, reeducated on use of call bell, bed alarm placed back on pt.

## 2022-05-11 NOTE — ED Notes (Signed)
Seizure pad in place on both side rails.

## 2022-05-11 NOTE — ED Notes (Signed)
This RN saw patient laying face down in the floor on fall mats. Primary RN aware. This RN and primary RN assisted pt back into bed. EDT placed at beside for direct observation as pt continues to get out of bed. Dr. Erma Heritage aware.

## 2022-05-12 NOTE — ED Provider Notes (Signed)
[facility] Provider Note    Event Date/Time   First MD Initiated Contact with Patient 05/11/22 1051     (approximate)   History   Seizures   HPI  Rodney Freeman is a 36 y.o. male  here with seizure like activity. Pt reportedly had a witnessed seizure like episode and presents for evaluation. States he does not remember what happened. Pt states he has a h/o "difficult to control" epilepsy. He has been under significant increased stress and states this causes his seizures. He does not remember what happens. Denies any recent trauma. No recent fever, chills. He has been taking his medications.       Physical Exam   Triage Vital Signs: ED Triage Vitals  Enc Vitals Group     BP 05/11/22 1043 109/85     Pulse Rate 05/11/22 1043 86     Resp 05/11/22 1043 16     Temp 05/11/22 1054 98.2 F (36.8 C)     Temp Source 05/11/22 1054 Oral     SpO2 05/11/22 1043 98 %     Weight --      Height --      Head Circumference --      Peak Flow --      Pain Score 05/11/22 1044 10     Pain Loc --      Pain Edu? --      Excl. in GC? --     Most recent vital signs: Vitals:   05/11/22 1200 05/11/22 1227  BP: 122/82 116/89  Pulse: 89 78  Resp: 17 18  Temp:  98.7 F (37.1 C)  SpO2: 100% 99%     General: Awake, no distress.  CV:  Good peripheral perfusion. RRR. No m/r/g. Resp:  Normal effort. Lungs CTAB. Abd:  No distention. No tenderness. Other:  AOx4. CNII-XII intact. Strength 5/5 bl UE and Le. Normal sensation to light touch.   ED Results / Procedures / Treatments   Labs (all labs ordered are listed, but only abnormal results are displayed) Labs Reviewed  BASIC METABOLIC PANEL - Abnormal; Notable for the following components:      Result Value   Sodium 146 (*)    All other components within normal limits  CBC - Abnormal; Notable for the following components:   RDW 11.1 (*)    All other components within normal limits      EKG    RADIOLOGY DPO:EUMPN, no PTX   I also independently reviewed and agree with radiologist interpretations.   PROCEDURES:  Critical Care performed: No  .1-3 Lead EKG Interpretation  Performed by: Shaune Pollack, MD Authorized by: Shaune Pollack, MD     Interpretation: normal     ECG rate:  70-90   ECG rate assessment: normal     Rhythm: sinus rhythm     Ectopy: none     Conduction: normal   Comments:     Indication: seizures     MEDICATIONS ORDERED IN ED: Medications  lamoTRIgine (LAMICTAL) tablet 200 mg (200 mg Oral Given 05/11/22 1143)  LORazepam (ATIVAN) tablet 1 mg (1 mg Oral Given 05/11/22 1214)     IMPRESSION / MDM / ASSESSMENT AND PLAN / ED COURSE  I reviewed the triage vital signs and the nursing notes.                              Differential diagnosis includes, but is not  limited to, epilepsy with breakthrough seizures, PNES, adjustment d/o, panic attacks, convulsive syncope  Patient's presentation is most consistent with acute presentation with potential threat to life or bodily function.  36 yo M with h/o epilepsy here with possible breakthrough seizure. H/o recurrent ED visits for same. Of note, pt had a witnessed episode here in which he tightened himself into a ball and started shaking, but would respond to painful/noxious stimuli and immediately came back to his usual self. Unclear if there may be a component of PNES. This also occurred as he was being told he needed to call someone for a ride. Of note, pt refused further evaluation or monitoring and demanded to be discharged after arriving. He had "fallen" in the room several times but was noted to lower himself to the ground over the pads.  I suspect pt does have underlying epilepsy, but unclear if current presentation is worsening of this, breakthrough, or possibly PNES. Regardless, he has no signs of major trauma, stable vitals, normal labs, and declines further w/u including addition  of meds here. Do think it is pertinent to give a brief klonopin taper as outpt given his history since he refuses to be observed, but o/w discharged in stable condition.  Labs reassuring. No leukocytosis. Lytes wnl. CXR clear.     FINAL CLINICAL IMPRESSION(S) / ED DIAGNOSES   Final diagnoses:  Seizure (HCC)     Rx / DC Orders   ED Discharge Orders          Ordered    clonazePAM (KLONOPIN) 0.5 MG tablet  2 times daily        05/11/22 1227             Note:  This document was prepared using Dragon voice recognition software and may include unintentional dictation errors.   Shaune Pollack, MD 05/12/22 [phone]

## 2022-06-02 ENCOUNTER — Encounter: Payer: Self-pay | Admitting: Emergency Medicine

## 2022-06-02 ENCOUNTER — Emergency Department
Admission: EM | Admit: 2022-06-02 | Discharge: 2022-06-03 | Disposition: A | Discharge status: Other Acute Inpatient Hospital | Payer: Medicaid Other | Attending: Emergency Medicine | Admitting: Emergency Medicine

## 2022-06-02 ENCOUNTER — Other Ambulatory Visit: Payer: Self-pay

## 2022-06-02 DIAGNOSIS — F332 Major depressive disorder, recurrent severe without psychotic features: Secondary | ICD-10-CM | POA: Insufficient documentation

## 2022-06-02 DIAGNOSIS — R45851 Suicidal ideations: Secondary | ICD-10-CM | POA: Insufficient documentation

## 2022-06-02 DIAGNOSIS — Z20822 Contact with and (suspected) exposure to covid-19: Secondary | ICD-10-CM | POA: Insufficient documentation

## 2022-06-02 DIAGNOSIS — Y906 Blood alcohol level of 120-199 mg/100 ml: Secondary | ICD-10-CM | POA: Diagnosis not present

## 2022-06-02 DIAGNOSIS — Z046 Encounter for general psychiatric examination, requested by authority: Secondary | ICD-10-CM | POA: Diagnosis present

## 2022-06-02 DIAGNOSIS — F32A Depression, unspecified: Secondary | ICD-10-CM

## 2022-06-02 DIAGNOSIS — F1020 Alcohol dependence, uncomplicated: Secondary | ICD-10-CM | POA: Diagnosis not present

## 2022-06-02 LAB — CBC
HCT: 41.2 % (ref 39.0–52.0)
Hemoglobin: 14.2 g/dL (ref 13.0–17.0)
MCH: 30.9 pg (ref 26.0–34.0)
MCHC: 34.5 g/dL (ref 30.0–36.0)
MCV: 89.6 fL (ref 80.0–100.0)
Platelets: 341 10*3/uL (ref 150–400)
RBC: 4.6 MIL/uL (ref 4.22–5.81)
RDW: 10.9 % — ABNORMAL LOW (ref 11.5–15.5)
WBC: 4.2 10*3/uL (ref 4.0–10.5)
nRBC: 0 % (ref 0.0–0.2)

## 2022-06-02 LAB — COMPREHENSIVE METABOLIC PANEL
ALT: 27 U/L (ref 0–44)
AST: 37 U/L (ref 15–41)
Albumin: 4.6 g/dL (ref 3.5–5.0)
Alkaline Phosphatase: 74 U/L (ref 38–126)
Anion gap: 11 (ref 5–15)
BUN: 8 mg/dL (ref 6–20)
CO2: 26 mmol/L (ref 22–32)
Calcium: 9.3 mg/dL (ref 8.9–10.3)
Chloride: 104 mmol/L (ref 98–111)
Creatinine, Ser: 0.82 mg/dL (ref 0.61–1.24)
GFR, Estimated: >60 mL/min (ref >60)
Glucose, Bld: 120 mg/dL — ABNORMAL HIGH (ref 70–99)
Potassium: 3.7 mmol/L (ref 3.5–5.1)
Sodium: 141 mmol/L (ref 135–145)
Total Bilirubin: 0.9 mg/dL (ref 0.3–1.2)
Total Protein: 7.8 g/dL (ref 6.5–8.1)

## 2022-06-02 LAB — RESP PANEL BY RT-PCR (RSV, FLU A&B, COVID)  RVPGX2
Influenza A by PCR: NEGATIVE
Influenza B by PCR: NEGATIVE
Resp Syncytial Virus by PCR: NEGATIVE
SARS Coronavirus 2 by RT PCR: NEGATIVE

## 2022-06-02 LAB — ETHANOL: Alcohol, Ethyl (B): 144 mg/dL — ABNORMAL HIGH (ref <10)

## 2022-06-02 LAB — URINE DRUG SCREEN, QUALITATIVE (ARMC ONLY)
Amphetamines, Ur Screen: NOT DETECTED
Barbiturates, Ur Screen: NOT DETECTED
Benzodiazepine, Ur Scrn: NOT DETECTED
Cannabinoid 50 Ng, Ur ~~LOC~~: POSITIVE — AB
Cocaine Metabolite,Ur ~~LOC~~: NOT DETECTED
MDMA (Ecstasy)Ur Screen: NOT DETECTED
Methadone Scn, Ur: NOT DETECTED
Opiate, Ur Screen: NOT DETECTED
Phencyclidine (PCP) Ur S: NOT DETECTED
Tricyclic, Ur Screen: NOT DETECTED

## 2022-06-02 LAB — ACETAMINOPHEN LEVEL: Acetaminophen (Tylenol), Serum: <10 ug/mL — ABNORMAL LOW (ref 10–30)

## 2022-06-02 LAB — SALICYLATE LEVEL: Salicylate Lvl: <7 mg/dL — ABNORMAL LOW (ref 7.0–30.0)

## 2022-06-02 MED ORDER — LAMOTRIGINE 100 MG PO TABS
200.0000 mg | ORAL_TABLET | Freq: Two times a day (BID) | ORAL | Status: DC
  Administered 2022-06-02 – 2022-06-03 (×2): 200 mg via ORAL
  Filled 2022-06-02 (×2): qty 2

## 2022-06-02 NOTE — ED Notes (Addendum)
Pt gave this RN permission to speak with mother on phone. Mother reports patient texted her this morning saying " I am done mom". Pt has also made statements in the past "I should just off myself like my aunt". Mother reports pt has hx of depression/substance abuse and has attempted suicide in the past. Per mother patient has been attempting to stop drinking after recent DUI, no luck getting in due to insurance. Mother very concerned with patients current mental status. Patient gives permission for psych team to speak with mother for collateral. Her number is [phone].

## 2022-06-02 NOTE — BH Assessment (Signed)
Referral information for Psychiatric Hospitalization faxed to;  [facility] ([phone]-or- [phone]),   [facility] ([phone]),  [facility] ([phone]),   [facility] ([phone] -or- [phone]),   [facility] ([phone])  [facility] ([phone])  

## 2022-06-02 NOTE — BH Assessment (Signed)
Comprehensive Clinical Assessment (CCA) Note  06/02/2022 [name] [id number]  Chief Complaint: Patient is a 36 year old male presenting to [facility] ED under IVC. Per triage note Pt here via acems and [facility] under IVC, but pt also had seizure at home and in route per report. Pt reports he takes lamotrigine & reports he has not missed a dose. According to report, gf of patient, pt had wrote note insinuating suicidal thoughts. Pt reports he has had a lot of stressors which include not being able to see his son since Dec 4th. Pt does have history of suicide attempt in the past. During assessment patient appears alert and oriented x4, calm and cooperative. Patient reports "I have epilepsy, I was told that I was IVC'd, I don't remember anything all I remember was I was getting ready for work and police showed up." When asked about a note left at his home patient does not recall. Patient does report that he drank alcohol "yesterday" and that he "moderately drinks" "2-3 times a week." Patient reports having a history of anxiety with some depression "there's some days I'm really down on myself, I haven't seen my son since December 4th" and he reports that his primary care doctor gave him a referral to a psychiatrist and a therapist and was prescribed Abilify. Patient reports "I don't take the meds because I can't afford them." Patient denies any previous inpatient mental health admissions. Patient denies SI/HI/AH/VH. Per day shift nursing staff, collateral was collected from the patient's mother, mother reports that the patient texted the mother this morning saying "I am done mom." Patient has also made statements in the past "I should just off myself like my aunt." Mother reports that patient has attempted suicide in the past and is concerned about his mental status.  Per Psyc NP Nanine Means patient is recommended for Inpatient Chief Complaint  Patient presents with   Seizures   Psychiatric Evaluation   Visit  Diagnosis: Major Depressive Disorder, recurrent episode, severe. Alcohol use disorder, severe    CCA Screening, Triage and Referral (STR)  Patient Reported Information How did you hear about [place]? Other (Comment)  Referral name: No data recorded Referral phone number: No data recorded  Whom do you see for routine medical problems? No data recorded Practice/Facility Name: No data recorded Practice/Facility Phone Number: No data recorded Name of Contact: No data recorded Contact Number: No data recorded Contact Fax Number: No data recorded Prescriber Name: No data recorded Prescriber Address (if known): No data recorded  What Is the Reason for Your Visit/Call Today? Pt here via acems and [facility] under IVC, but pt also had seizure at home and in route per report. Pt reports he takes lamotrigine & reports he has not missed a dose. According to report, gf of patient, pt had wrote note insinuating suicidal thoughts. Pt reports he has had a lot of stressors which include not being able to see his son since Dec 4th. Pt does have history of suicide attempt in the past.  How Long Has This Been Causing You Problems? > than 6 months  What Do You Feel Would Help You the Most Today? No data recorded  Have You Recently Been in Any Inpatient Treatment (Hospital/Detox/Crisis Center/28-Day Program)? No data recorded Name/Location of Program/Hospital:No data recorded How Long Were You There? No data recorded When Were You Discharged? No data recorded  Have You Ever Received Services From [facility] Before? No data recorded Who Do You See at [facility]?  No data recorded  Have You Recently Had Any Thoughts About Hurting Yourself? No  Are You Planning to Commit Suicide/Harm Yourself At This time? No   Have you Recently Had Thoughts About Hurting Someone Karolee Ohs? No  Explanation: No data recorded  Have You Used Any Alcohol or Drugs in the Past 24 Hours? Yes  How Long Ago Did You Use Drugs or Alcohol?  No data recorded What Did You Use and How Much? Alcohol "moderate amount"   Do You Currently Have a Therapist/Psychiatrist? No  Name of Therapist/Psychiatrist: No data recorded  Have You Been Recently Discharged From Any Office Practice or Programs? No  Explanation of Discharge From Practice/Program: No data recorded    CCA Screening Triage Referral Assessment Type of Contact: Face-to-Face  Is this Initial or Reassessment? No data recorded Date Telepsych consult ordered in [facility]:  No data recorded Time Telepsych consult ordered in [facility]:  No data recorded  Patient Reported Information Reviewed? No data recorded Patient Left Without Being Seen? No data recorded Reason for Not Completing Assessment: No data recorded  Collateral Involvement: No data recorded  Does Patient Have a Court Appointed Legal Guardian? No data recorded Name and Contact of Legal Guardian: No data recorded If Minor and Not Living with Parent(s), Who has Custody? No data recorded Is CPS involved or ever been involved? Never  Is APS involved or ever been involved? Never   Patient Determined To Be At Risk for Harm To Self or Others Based on Review of Patient Reported Information or Presenting Complaint? No data recorded Method: No data recorded Availability of Means: No data recorded Intent: No data recorded Notification Required: No data recorded Additional Information for Danger to Others Potential: No data recorded Additional Comments for Danger to Others Potential: No data recorded Are There Guns or Other Weapons in Your Home? No  Types of Guns/Weapons: No data recorded Are These Weapons Safely Secured?                            No data recorded Who Could Verify You Are Able To Have These Secured: No data recorded Do You Have any Outstanding Charges, Pending Court Dates, Parole/Probation? No data recorded Contacted To Inform of Risk of Harm To Self or Others: No data recorded  Location of Assessment:  [facility] ED   Does Patient Present under Involuntary Commitment? Yes  IVC Papers Initial File Date: No data recorded  [place] of Residence: [place]   Patient Currently Receiving the Following Services: No data recorded  Determination of Need: Emergent (2 hours)   Options For Referral: No data recorded    CCA Biopsychosocial Intake/Chief Complaint:  No data recorded Current Symptoms/Problems: No data recorded  Patient Reported Schizophrenia/Schizoaffective Diagnosis in Past: No   Strengths: Patient is able to communicate his needs  Preferences: No data recorded Abilities: No data recorded  Type of Services Patient Feels are Needed: No data recorded  Initial Clinical Notes/Concerns: No data recorded  Mental Health Symptoms Depression:   Change in energy/activity; Difficulty Concentrating   Duration of Depressive symptoms:  Greater than two weeks   Mania:   None   Anxiety:    Tension; Worrying   Psychosis:   None   Duration of Psychotic symptoms: No data recorded  Trauma:   None   Obsessions:   None   Compulsions:   None   Inattention:   None   Hyperactivity/Impulsivity:   None   Oppositional/Defiant  Behaviors:   None   Emotional Irregularity:   None   Other Mood/Personality Symptoms:  No data recorded   Mental Status Exam Appearance and self-care  Stature:   Average   Weight:   Average weight   Clothing:   Casual   Grooming:   Normal   Cosmetic use:   None   Posture/gait:   Normal   Motor activity:   Not Remarkable   Sensorium  Attention:   Normal   Concentration:   Normal   Orientation:   X5   Recall/memory:   Normal   Affect and Mood  Affect:   Appropriate   Mood:   Other (Comment)   Relating  Eye contact:   Normal   Facial expression:   Responsive   Attitude toward examiner:   Cooperative   Thought and Language  Speech flow:  Clear and Coherent   Thought content:   Appropriate to Mood and  Circumstances   Preoccupation:   None   Hallucinations:   None   Organization:  No data recorded  [employer] of Knowledge:   Fair   Intelligence:   Average   Abstraction:   Normal   Judgement:   Fair   Dance movement psychotherapist:   Adequate   Insight:   Lacking; Poor   Decision Making:   Normal   Social Functioning  Social Maturity:   Responsible   Social Judgement:   Normal   Stress  Stressors:   Family conflict   Coping Ability:   Normal   Skill Deficits:   None   Supports:   Family; Friends/Service system     Religion: Religion/Spirituality Are You A Religious Person?: No  Leisure/Recreation: Leisure / Recreation Do You Have Hobbies?: No  Exercise/Diet: Exercise/Diet Do You Exercise?: No Have You Gained or Lost A Significant Amount of Weight in the Past Six Months?: No Do You Follow a Special Diet?: No Do You Have Any Trouble Sleeping?: No   CCA Employment/Education Employment/Work Situation: Employment / Work Situation Employment Situation: Employed Work Stressors: None reported Patient's Job has Been Impacted by Current Illness: No Has Patient ever Been in Equities trader?: No  Education: Education Is Patient Currently Attending School?: No Did You Have An Individualized Education Program (IIEP): No Did You Have Any Difficulty At [employer]?: No Patient's Education Has Been Impacted by Current Illness: No   CCA Family/Childhood History Family and Relationship History: Family history Marital status: Single Does patient have children?: Yes How many children?: 1 How is patient's relationship with their children?: Patient has not seen his son since "December 4th"  Childhood History:  Childhood History Did patient suffer any verbal/emotional/physical/sexual abuse as a child?: No Did patient suffer from severe childhood neglect?: No Has patient ever been sexually abused/assaulted/raped as an adolescent or adult?: No Was the  patient ever a victim of a crime or a disaster?: No Witnessed domestic violence?: No Has patient been affected by domestic violence as an adult?: No  Child/Adolescent Assessment:     CCA Substance Use Alcohol/Drug Use: Alcohol / Drug Use Pain Medications: See MAR Prescriptions: See MAR Over the Counter: See MAR History of alcohol / drug use?: Yes Substance #1 Name of Substance 1: Alcohol 1 - Age of First Use: Unknown 1 - Amount (size/oz): "moderate amount" 1 - Frequency: "2-3 times a week" 1 - Last Use / Amount: 06/01/22  ASAM's:  Six Dimensions of Multidimensional Assessment  Dimension 1:  Acute Intoxication and/or Withdrawal Potential:      Dimension 2:  Biomedical Conditions and Complications:      Dimension 3:  Emotional, Behavioral, or Cognitive Conditions and Complications:     Dimension 4:  Readiness to Change:     Dimension 5:  Relapse, Continued use, or Continued Problem Potential:     Dimension 6:  Recovery/Living Environment:     ASAM Severity Score:    ASAM Recommended Level of Treatment:     Substance use Disorder (SUD) Substance Use Disorder (SUD)  Checklist Symptoms of Substance Use: Continued use despite having a persistent/recurrent physical/psychological problem caused/exacerbated by use, Continued use despite persistent or recurrent social, interpersonal problems, caused or exacerbated by use, Evidence of withdrawal (Comment), Large amounts of time spent to obtain, use or recover from the substance(s), Social, occupational, recreational activities given up or reduced due to use, Substance(s) often taken in larger amounts or over longer times than was intended  Recommendations for Services/Supports/Treatments:    DSM5 Diagnoses: Patient Active Problem List   Diagnosis Date Noted   Alcohol intoxication (HCC) 09/19/2021   Anxiety and depression 09/19/2021   Seizure (HCC) 05/05/2021    Patient Centered Plan: Patient is  on the following Treatment Plan(s):  Anxiety, Depression, and Substance Abuse   Referrals to Alternative Service(s): Referred to Alternative Service(s):   Place:   Date:   Time:    Referred to Alternative Service(s):   Place:   Date:   Time:    Referred to Alternative Service(s):   Place:   Date:   Time:    Referred to Alternative Service(s):   Place:   Date:   Time:      @BHCOLLABOFCARE @  Owens CorningJamila A Dublin Grayer, LCAS-A

## 2022-06-02 NOTE — ED Triage Notes (Signed)
Pt here via acems and [facility] under IVC, but pt also had seizure at home and in route per report. Pt reports he takes lamotrigine & reports he has not missed a dose. According to report, gf of patient, pt had wrote note insinuating suicidal thoughts. Pt reports he has had a lot of stressors which include not being able to see his son since Dec 4th. Pt does have history of suicide attempt in the past.

## 2022-06-02 NOTE — ED Notes (Signed)
IVC/pending psych consult 

## 2022-06-02 NOTE — ED Notes (Signed)
Patient asking when he is going to be seen so he can leave.  Writer informed patient is can't leave at this time due to IVC paperwork. Patient was questioning how or why he was IVC'ed.  Writer explained the procedures at this time and he would need to be cleared by medical and psych team and paperwork rescinded. Patient asking for something to eat at this time.

## 2022-06-02 NOTE — ED Notes (Signed)
IVC prior to arrival/ Consult ordered/Pending 

## 2022-06-02 NOTE — ED Notes (Signed)

## 2022-06-02 NOTE — ED Notes (Signed)
Pt given snack and drink.  Pt dressed out in burgundy scrubs from a hospital gown he was in.  Pt also had black underwear and black socks that were bagged, labelled and placed at nurses station.

## 2022-06-02 NOTE — ED Provider Notes (Signed)
[facility] Provider Note    Event Date/Time   First MD Initiated Contact with Patient 06/02/22 1659     (approximate)   History   Seizures and Psychiatric Evaluation   HPI  Rodney Freeman is a 36 y.o. male  who presents to the emergency department today under IVC for SI. The patient himself states that he believes he is here because of a seizure. Says he has history of seizures, does not recall missing any doses of his medication. He states that he has been under a lot of stress recently. Has not been sleeping well. Denies any recent illness.       Physical Exam   Triage Vital Signs: ED Triage Vitals  Enc Vitals Group     BP 06/02/22 1717 (!) 110/95     Pulse Rate 06/02/22 1717 74     Resp 06/02/22 1717 17     Temp 06/02/22 1717 98.2 F (36.8 C)     Temp Source 06/02/22 1717 Oral     SpO2 06/02/22 1717 99 %     Weight --      Height 06/02/22 1720 5\' 7"  (1.702 m)     Head Circumference --      Peak Flow --      Pain Score 06/02/22 1717 10     Pain Loc --      Pain Edu? --      Excl. in GC? --     Most recent vital signs: Vitals:   06/02/22 1717  BP: (!) 110/95  Pulse: 74  Resp: 17  Temp: 98.2 F (36.8 C)  SpO2: 99%    General: Awake, alert, oriented. CV:  Good peripheral perfusion. Regular rate and rhythm. Resp:  Normal effort. Lungs clear. Abd:  No distention.  Other:  Withdrawn. Appears depressed.    ED Results / Procedures / Treatments   Labs (all labs ordered are listed, but only abnormal results are displayed) Labs Reviewed  COMPREHENSIVE METABOLIC PANEL - Abnormal; Notable for the following components:      Result Value   Glucose, Bld 120 (*)    All other components within normal limits  ETHANOL - Abnormal; Notable for the following components:   Alcohol, Ethyl (B) 144 (*)    All other components within normal limits  SALICYLATE LEVEL - Abnormal; Notable for the following components:   Salicylate Lvl <7.0 (*)     All other components within normal limits  ACETAMINOPHEN LEVEL - Abnormal; Notable for the following components:   Acetaminophen (Tylenol), Serum <10 (*)    All other components within normal limits  CBC - Abnormal; Notable for the following components:   RDW 10.9 (*)    All other components within normal limits  URINE DRUG SCREEN, QUALITATIVE ([facility] ONLY) - Abnormal; Notable for the following components:   Cannabinoid 50 Ng, Ur [place] POSITIVE (*)    All other components within normal limits  RESP PANEL BY RT-PCR (RSV, FLU A&B, COVID)  RVPGX2  LAMOTRIGINE LEVEL     EKG  None   RADIOLOGY None   PROCEDURES:  Critical Care performed: No  Procedures   MEDICATIONS ORDERED IN ED: Medications - No data to display   IMPRESSION / MDM / ASSESSMENT AND PLAN / ED COURSE  I reviewed the triage vital signs and the nursing notes.  Differential diagnosis includes, but is not limited to, depression, drug induced mood disorder, seizure, electrolyte abnormality  Patient's presentation is most consistent with acute presentation with potential threat to life or bodily function.  Patient presented to the emergency department today under IVC because of concerns for suicidal ideation.  On exam patient is withdrawn.  Additionally patient states that he had a seizure.  Patient blood work does show elevated ethanol level which might lower the seizure threshold.  Patient however without any further seizure like activity here in the emergency department.  Patient was seen by psychiatry who will plan on admission.  The patient has been placed in psychiatric observation due to the need to provide a safe environment for the patient while obtaining psychiatric consultation and evaluation, as well as ongoing medical and medication management to treat the patient's condition.  The patient has been placed under full IVC at this time.   FINAL CLINICAL IMPRESSION(S) / ED DIAGNOSES    Final diagnoses:  Depression, unspecified depression type       Note:  This document was prepared using Dragon voice recognition software and may include unintentional dictation errors.    Phineas Semen, MD 06/02/22 [phone]

## 2022-06-02 NOTE — BH Assessment (Signed)
PATIENT BED AVAILABLE AFTER 8AM FOR 06/03/22  Patient has been accepted to [facility]  Patient assigned to [facility]  Accepting physician is Dr. Estill Cotta.  Call report to [phone].  Representative was [employer].   ER Staff is aware of it:  Melody ER Secretary  Dr. Derrill Kay, ER MD  Cala Bradford Patient's Nurse

## 2022-06-03 NOTE — ED Notes (Signed)
IVC/pending admission to [facility] after 8 AM

## 2022-06-03 NOTE — ED Notes (Signed)
One bag of belongings given to transport.

## 2022-06-03 NOTE — ED Notes (Signed)
This RN attempted to call [facility] for report. No answer.

## 2022-06-03 NOTE — ED Notes (Signed)
This RN attempted to call report to [place] again. No response.

## 2022-06-03 NOTE — ED Notes (Signed)
Pt refused breakfast tray stating, "I'd rather starve than eat that food". Crystal RN made aware. Pt kept drink.

## 2022-06-03 NOTE — ED Notes (Signed)
At [phone] PD called and advised pt is stating her has more stuff. None noted in chart. Staff found another bag locked in the BHU. Given to transport.

## 2022-06-03 NOTE — ED Notes (Signed)
This Rn spoke with RN Evalee Mutton at [facility].

## 2022-06-08 LAB — LAMOTRIGINE LEVEL: Lamotrigine Lvl: 4.2 ug/mL (ref 2.0–20.0)

## 2023-05-10 ENCOUNTER — Emergency Department
Admission: EM | Admit: 2023-05-10 | Discharge: 2023-05-10 | Disposition: A | Discharge status: Home / Self Care | Payer: 59 | Attending: Emergency Medicine | Admitting: Emergency Medicine

## 2023-05-10 ENCOUNTER — Emergency Department: Payer: 59

## 2023-05-10 ENCOUNTER — Other Ambulatory Visit: Payer: Self-pay

## 2023-05-10 DIAGNOSIS — R569 Unspecified convulsions: Secondary | ICD-10-CM | POA: Diagnosis present

## 2023-05-10 DIAGNOSIS — G40909 Epilepsy, unspecified, not intractable, without status epilepticus: Secondary | ICD-10-CM | POA: Diagnosis not present

## 2023-05-10 HISTORY — DX: Epilepsy, unspecified, not intractable, without status epilepticus: G40.909

## 2023-05-10 LAB — CBC WITH DIFFERENTIAL/PLATELET
Abs Immature Granulocytes: 0.06 10*3/uL (ref 0.00–0.07)
Basophils Absolute: 0.1 10*3/uL (ref 0.0–0.1)
Basophils Relative: 1 %
Eosinophils Absolute: 0.2 10*3/uL (ref 0.0–0.5)
Eosinophils Relative: 3 %
HCT: 40.3 % (ref 39.0–52.0)
Hemoglobin: 14.1 g/dL (ref 13.0–17.0)
Immature Granulocytes: 1 %
Lymphocytes Relative: 42 %
Lymphs Abs: 3 10*3/uL (ref 0.7–4.0)
MCH: 32.2 pg (ref 26.0–34.0)
MCHC: 35 g/dL (ref 30.0–36.0)
MCV: 92 fL (ref 80.0–100.0)
Monocytes Absolute: 0.4 10*3/uL (ref 0.1–1.0)
Monocytes Relative: 6 %
Neutro Abs: 3.4 10*3/uL (ref 1.7–7.7)
Neutrophils Relative %: 47 %
Platelets: 362 10*3/uL (ref 150–400)
RBC: 4.38 MIL/uL (ref 4.22–5.81)
RDW: 11.9 % (ref 11.5–15.5)
WBC: 7.1 10*3/uL (ref 4.0–10.5)
nRBC: 0 % (ref 0.0–0.2)

## 2023-05-10 LAB — COMPREHENSIVE METABOLIC PANEL
ALT: 30 U/L (ref 0–44)
AST: 51 U/L — ABNORMAL HIGH (ref 15–41)
Albumin: 4 g/dL (ref 3.5–5.0)
Alkaline Phosphatase: 63 U/L (ref 38–126)
Anion gap: 11 (ref 5–15)
BUN: 9 mg/dL (ref 6–20)
CO2: 27 mmol/L (ref 22–32)
Calcium: 8.1 mg/dL — ABNORMAL LOW (ref 8.9–10.3)
Chloride: 105 mmol/L (ref 98–111)
Creatinine, Ser: 0.79 mg/dL (ref 0.61–1.24)
GFR, Estimated: >60 mL/min (ref >60)
Glucose, Bld: 91 mg/dL (ref 70–99)
Potassium: 3.4 mmol/L — ABNORMAL LOW (ref 3.5–5.1)
Sodium: 143 mmol/L (ref 135–145)
Total Bilirubin: 0.4 mg/dL (ref <1.2)
Total Protein: 6.7 g/dL (ref 6.5–8.1)

## 2023-05-10 MED ORDER — LEVETIRACETAM 500 MG PO TABS: 1000.0000 mg | ORAL_TABLET | Freq: Once | ORAL | Status: DC

## 2023-05-10 MED ORDER — LAMOTRIGINE 100 MG PO TABS: 200.0000 mg | ORAL_TABLET | Freq: Once | ORAL | Status: DC

## 2023-05-10 NOTE — ED Notes (Signed)
Patient transported to CT at this time.

## 2023-05-10 NOTE — ED Notes (Signed)
Patient made aware if he was leaving that this RN needed to take his IV out. Patient agreeable and held out arm for RN to take out IV

## 2023-05-10 NOTE — ED Notes (Signed)
Pt started taking off cardiac monitor leads, pulse ox, and blood pressure cuff. Pt stated he was leaving and left hospital with girlfriend.

## 2023-05-10 NOTE — Discharge Instructions (Signed)
You were seen in the ER for your seizure-like activity.  You did not wish to complete your evaluation or take antiseizure medication.  You are welcome to return at any time to complete your evaluation.

## 2023-05-10 NOTE — ED Notes (Signed)
MD Ray at bedside after making aware patient is ready to leave. MD Ray speaking with patient about being discharged.

## 2023-05-10 NOTE — ED Notes (Signed)
Patient refused to speak with Xray when in room. Xray left and patient unhooked himself from monitor and was walking around the room. Patient states he is ready for his xray now. Xray made aware. Patient asked to stay in bed and stay hooked up to the monitor

## 2023-05-10 NOTE — ED Notes (Signed)
Patient's girlfriend at bedside
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
## 2023-05-10 NOTE — ED Notes (Signed)
Patient ambulated out with girlfriend. NAD noted

## 2023-05-10 NOTE — ED Notes (Signed)
Girlfriend called staff into room, concerned patient is having a seizure. Patient is coughing and placing his face into the bed. Patient told to take some deep breaths.

## 2023-05-10 NOTE — ED Provider Notes (Signed)
[facility] Provider Note    Event Date/Time   First MD Initiated Contact with Patient 05/10/23 1144     (approximate)   History   Seizures   HPI  Rodney Freeman is a 37 year old male presenting to the emergency department for evaluation of seizure-like activity.  Patient reports history of seizure disorder, initially told me he was on lamotrigine and had not missed any doses.  With EMS, he reportedly had 3 seizure-like episodes.  Here, patient is complaining that a police officer ran over his foot, but EMS reports that bystanders said that this did not happen.  He received 2.5 mg of Versed with EMS, did not have any further seizures following that.  On reevaluation, patient tells me that he has actually been out of his seizure medication for several months.  He tells me that Keppra was not effective for him and initially said lamotrigine was working, but later tells me that he does not think any of the medicines worked for him.  Does feel back at his baseline.      Physical Exam   Triage Vital Signs: ED Triage Vitals  Encounter Vitals Group     BP 05/10/23 1147 (!) 124/91     Systolic BP Percentile --      Diastolic BP Percentile --      Pulse Rate 05/10/23 1147 91     Resp 05/10/23 1147 18     Temp 05/10/23 1147 98.6 F (37 C)     Temp Source 05/10/23 1147 Oral     SpO2 05/10/23 1147 97 %     Weight 05/10/23 1151 145 lb (65.8 kg)     Height 05/10/23 1151 5\' 7"  (1.702 m)     Head Circumference --      Peak Flow --      Pain Score 05/10/23 1151 6     Pain Loc --      Pain Education --      Exclude from Growth Chart --     Most recent vital signs: Vitals:   05/10/23 1230 05/10/23 1315  BP: (!) 134/100   Pulse: 90 75  Resp: (!) 23 12  Temp:    SpO2: 94% 97%     General: Initially somnolent but readily arousable, later awake, interactive CV:  Regular rate, good peripheral perfusion.  Resp:  Unlabored respirations.  Abd:  Nondistended.   Neuro:  Symmetric facial movement, fluid speech MSK:  Mild tenderness over the dorsum of the right foot without overlying skin changes or palpable deformity, 2+ DP pulses bilaterally   ED Results / Procedures / Treatments   Labs (all labs ordered are listed, but only abnormal results are displayed) Labs Reviewed  COMPREHENSIVE METABOLIC PANEL - Abnormal; Notable for the following components:      Result Value   Potassium 3.4 (*)    Calcium 8.1 (*)    AST 51 (*)    All other components within normal limits  CBC WITH DIFFERENTIAL/PLATELET  LAMOTRIGINE LEVEL  LEVETIRACETAM LEVEL     EKG EKG independently reviewed interpreted by myself (ER attending) demonstrates:  EKG demonstrate sinus rhythm at a rate of 88, PR 170, QRS 105, QTc 430, no acute ST changes  RADIOLOGY Imaging independently reviewed and interpreted by myself demonstrates:  CT head without acute bleed X-Angelica Wix of the foot without obvious fracture  PROCEDURES:  Critical Care performed: No  Procedures   MEDICATIONS ORDERED IN ED: Medications  lamoTRIgine (LAMICTAL) tablet 200 mg (  has no administration in time range)     IMPRESSION / MDM / ASSESSMENT AND PLAN / ED COURSE  I reviewed the triage vital signs and the nursing notes.  Differential diagnosis includes, but is not limited to, breakthrough seizure in the setting of medication noncompliance, PNES, electrolyte abnormality  Patient's presentation is most consistent with acute presentation with potential threat to life or bodily function.  37 year old male presenting with multiple seizure-like episode.  Labs here without critical derangements including normal sodium.  I reevaluated the patient and he was on the phone with his mom who lives in [place] but did express concerns that patient may have sustained head trauma during the seizure episode.  With this, head CT was ordered.  Patient trauma to the foot, no obvious external trauma on exam, x-Kelvyn Schunk was  ordered.  While awaiting results of workup, I was notified by the RN that she witnessed the patient crawled out of bed into the hallway and then was noted to be laying on the floor.  No rhythmic movement noted.  He was assisted back to his bed.  No head injury or other trauma noted.  Imaging was completed following this episode. Following completion of his imaging, but prior to results I was notified that patient was requesting to leave.  I reevaluated the patient.  He did appear to be at his mental status baseline.  I discussed his workup was not complete including the results of his head CT.  I discussed possible life-threatening pathology.  Patient continues to request discharge.  Patient does appear to be at his mental status baseline with decision-making capacity on my evaluation.  Girlfriend present at bedside does agree that he appears at baseline.  Initially had planned to discharge patient with prescription for lamotrigine but tells me that he does not think this helps and he will not take it.  He was given an open invitation to return.  The patient wants leave against medical advise. I spoke with the patient extensively regarding the risks of leaving prior to completion of an appropriate workup and interventions given their symptomatology. We discussed possibility of significant morbidity and death that may occur as a result of them leaving. The patient expressed understanding of the situation and items mentioned, however would like to leave our care.   I have determined that the patient has the capacity for this medical decision. I have discussed the risks and benefits of the proposed treatment and treatment alternatives including non-treatment. I have offered an open invitation to the patient to return at any time for care. I have determined that the patient understands this discussion and that the patient willingly assumes the potential risk to their well-being as a result of this informed  refusal.    FINAL CLINICAL IMPRESSION(S) / ED DIAGNOSES   Final diagnoses:  Seizure-like activity (HCC)     Rx / DC Orders   ED Discharge Orders     None        Note:  This document was prepared using Dragon voice recognition software and may include unintentional dictation errors.   Trinna Post, MD 05/10/23 [phone]

## 2023-05-10 NOTE — ED Notes (Addendum)
This RN spotted patient crawling into the hallway when walking out of another room. Patient had unhooked himself from monitor. He laid himself down on the ground, after crawling and was making grunting noises. MD Ray made aware and at patients side. Patient stood with help of staff and ambulated back to his bed in room.

## 2023-05-11 LAB — LAMOTRIGINE LEVEL: Lamotrigine Lvl: <1 ug/mL — ABNORMAL LOW (ref 2.0–20.0)

## 2023-05-12 LAB — LEVETIRACETAM LEVEL: Levetiracetam Lvl: <2 ug/mL — ABNORMAL LOW (ref 10.0–40.0)

## 2023-06-01 ENCOUNTER — Encounter (HOSPITAL_COMMUNITY): Payer: Self-pay

## 2023-06-01 ENCOUNTER — Inpatient Hospital Stay (HOSPITAL_COMMUNITY): Payer: 59

## 2023-06-01 ENCOUNTER — Emergency Department (HOSPITAL_COMMUNITY): Payer: 59

## 2023-06-01 ENCOUNTER — Inpatient Hospital Stay (HOSPITAL_COMMUNITY)
Admission: EM | Admit: 2023-06-01 | Discharge: 2023-06-04 | DRG: 100 | Disposition: A | Discharge status: Home / Self Care | Payer: 59 | Attending: Pulmonary Disease | Admitting: Pulmonary Disease

## 2023-06-01 DIAGNOSIS — Y908 Blood alcohol level of 240 mg/100 ml or more: Secondary | ICD-10-CM | POA: Diagnosis present

## 2023-06-01 DIAGNOSIS — F17290 Nicotine dependence, other tobacco product, uncomplicated: Secondary | ICD-10-CM | POA: Diagnosis present

## 2023-06-01 DIAGNOSIS — G40901 Epilepsy, unspecified, not intractable, with status epilepticus: Secondary | ICD-10-CM | POA: Diagnosis not present

## 2023-06-01 DIAGNOSIS — I959 Hypotension, unspecified: Secondary | ICD-10-CM | POA: Diagnosis not present

## 2023-06-01 DIAGNOSIS — D649 Anemia, unspecified: Secondary | ICD-10-CM | POA: Diagnosis present

## 2023-06-01 DIAGNOSIS — Z91148 Patient's other noncompliance with medication regimen for other reason: Secondary | ICD-10-CM | POA: Diagnosis not present

## 2023-06-01 DIAGNOSIS — T426X6A Underdosing of other antiepileptic and sedative-hypnotic drugs, initial encounter: Secondary | ICD-10-CM | POA: Diagnosis present

## 2023-06-01 DIAGNOSIS — F10139 Alcohol abuse with withdrawal, unspecified: Secondary | ICD-10-CM | POA: Diagnosis not present

## 2023-06-01 DIAGNOSIS — Y92009 Unspecified place in unspecified non-institutional (private) residence as the place of occurrence of the external cause: Secondary | ICD-10-CM | POA: Diagnosis not present

## 2023-06-01 DIAGNOSIS — R569 Unspecified convulsions: Principal | ICD-10-CM

## 2023-06-01 DIAGNOSIS — J9601 Acute respiratory failure with hypoxia: Secondary | ICD-10-CM

## 2023-06-01 DIAGNOSIS — F17210 Nicotine dependence, cigarettes, uncomplicated: Secondary | ICD-10-CM | POA: Diagnosis present

## 2023-06-01 DIAGNOSIS — D509 Iron deficiency anemia, unspecified: Secondary | ICD-10-CM | POA: Diagnosis not present

## 2023-06-01 DIAGNOSIS — F10129 Alcohol abuse with intoxication, unspecified: Secondary | ICD-10-CM | POA: Diagnosis present

## 2023-06-01 DIAGNOSIS — E162 Hypoglycemia, unspecified: Secondary | ICD-10-CM | POA: Diagnosis not present

## 2023-06-01 DIAGNOSIS — F10929 Alcohol use, unspecified with intoxication, unspecified: Secondary | ICD-10-CM

## 2023-06-01 DIAGNOSIS — R092 Respiratory arrest: Secondary | ICD-10-CM

## 2023-06-01 DIAGNOSIS — Z91128 Patient's intentional underdosing of medication regimen for other reason: Secondary | ICD-10-CM | POA: Diagnosis not present

## 2023-06-01 DIAGNOSIS — K920 Hematemesis: Secondary | ICD-10-CM | POA: Diagnosis not present

## 2023-06-01 DIAGNOSIS — K625 Hemorrhage of anus and rectum: Secondary | ICD-10-CM | POA: Diagnosis not present

## 2023-06-01 LAB — CBC WITH DIFFERENTIAL/PLATELET
Abs Immature Granulocytes: 0.01 10*3/uL (ref 0.00–0.07)
Basophils Absolute: 0.1 10*3/uL (ref 0.0–0.1)
Basophils Relative: 2 %
Eosinophils Absolute: 0.1 10*3/uL (ref 0.0–0.5)
Eosinophils Relative: 3 %
HCT: 40 % (ref 39.0–52.0)
Hemoglobin: 13.9 g/dL (ref 13.0–17.0)
Immature Granulocytes: 0 %
Lymphocytes Relative: 59 %
Lymphs Abs: 2.4 10*3/uL (ref 0.7–4.0)
MCH: 32.2 pg (ref 26.0–34.0)
MCHC: 34.8 g/dL (ref 30.0–36.0)
MCV: 92.6 fL (ref 80.0–100.0)
Monocytes Absolute: 0.2 10*3/uL (ref 0.1–1.0)
Monocytes Relative: 6 %
Neutro Abs: 1.2 10*3/uL — ABNORMAL LOW (ref 1.7–7.7)
Neutrophils Relative %: 30 %
Platelets: 295 10*3/uL (ref 150–400)
RBC: 4.32 MIL/uL (ref 4.22–5.81)
RDW: 12 % (ref 11.5–15.5)
WBC: 4 10*3/uL (ref 4.0–10.5)
nRBC: 0 % (ref 0.0–0.2)

## 2023-06-01 LAB — URINALYSIS, W/ REFLEX TO CULTURE (INFECTION SUSPECTED)
Bilirubin Urine: NEGATIVE
Glucose, UA: NEGATIVE mg/dL
Hgb urine dipstick: NEGATIVE
Ketones, ur: NEGATIVE mg/dL
Leukocytes,Ua: NEGATIVE
Nitrite: NEGATIVE
Protein, ur: NEGATIVE mg/dL
Specific Gravity, Urine: 1.004 — ABNORMAL LOW (ref 1.005–1.030)
pH: 5 (ref 5.0–8.0)

## 2023-06-01 LAB — I-STAT ARTERIAL BLOOD GAS, ED
Acid-base deficit: 2 mmol/L (ref 0.0–2.0)
Bicarbonate: 23.9 mmol/L (ref 20.0–28.0)
Calcium, Ion: 1.18 mmol/L (ref 1.15–1.40)
HCT: 41 % (ref 39.0–52.0)
Hemoglobin: 13.9 g/dL (ref 13.0–17.0)
O2 Saturation: 100 %
Patient temperature: 96.6
Potassium: 4 mmol/L (ref 3.5–5.1)
Sodium: 143 mmol/L (ref 135–145)
TCO2: 25 mmol/L (ref 22–32)
pCO2 arterial: 42.6 mm[Hg] (ref 32–48)
pH, Arterial: 7.352 (ref 7.35–7.45)
pO2, Arterial: 460 mm[Hg] — ABNORMAL HIGH (ref 83–108)

## 2023-06-01 LAB — COMPREHENSIVE METABOLIC PANEL
ALT: 23 U/L (ref 0–44)
AST: 44 U/L — ABNORMAL HIGH (ref 15–41)
Albumin: 4 g/dL (ref 3.5–5.0)
Alkaline Phosphatase: 69 U/L (ref 38–126)
Anion gap: 9 (ref 5–15)
BUN: 6 mg/dL (ref 6–20)
CO2: 22 mmol/L (ref 22–32)
Calcium: 8.6 mg/dL — ABNORMAL LOW (ref 8.9–10.3)
Chloride: 110 mmol/L (ref 98–111)
Creatinine, Ser: 0.73 mg/dL (ref 0.61–1.24)
GFR, Estimated: >60 mL/min (ref >60)
Glucose, Bld: 87 mg/dL (ref 70–99)
Potassium: 3.9 mmol/L (ref 3.5–5.1)
Sodium: 141 mmol/L (ref 135–145)
Total Bilirubin: 0.4 mg/dL (ref <1.2)
Total Protein: 6.5 g/dL (ref 6.5–8.1)

## 2023-06-01 LAB — RAPID URINE DRUG SCREEN, HOSP PERFORMED
Amphetamines: NOT DETECTED
Barbiturates: NOT DETECTED
Benzodiazepines: POSITIVE — AB
Cocaine: NOT DETECTED
Opiates: NOT DETECTED
Tetrahydrocannabinol: POSITIVE — AB

## 2023-06-01 LAB — ETHANOL: Alcohol, Ethyl (B): 307 mg/dL (ref <10)

## 2023-06-01 LAB — SALICYLATE LEVEL: Salicylate Lvl: <7 mg/dL — ABNORMAL LOW (ref 7.0–30.0)

## 2023-06-01 LAB — ACETAMINOPHEN LEVEL: Acetaminophen (Tylenol), Serum: <10 ug/mL — ABNORMAL LOW (ref 10–30)

## 2023-06-01 LAB — CBG MONITORING, ED: Glucose-Capillary: 88 mg/dL (ref 70–99)

## 2023-06-01 MED ORDER — ETOMIDATE 2 MG/ML IV SOLN
20.0000 mg | Freq: Once | INTRAVENOUS | Status: DC
  Filled 2023-06-01: qty 10

## 2023-06-01 MED ORDER — LACTATED RINGERS IV BOLUS
1000.0000 mL | Freq: Once | INTRAVENOUS | Status: AC
  Administered 2023-06-01: 1000 mL via INTRAVENOUS

## 2023-06-01 MED ORDER — SODIUM CHLORIDE 0.9 % IV SOLN
250.0000 mL | INTRAVENOUS | Status: DC
  Administered 2023-06-01: 250 mL via INTRAVENOUS

## 2023-06-01 MED ORDER — SODIUM CHLORIDE 0.9 % IV SOLN: 250.0000 mL | INTRAVENOUS | Status: DC

## 2023-06-01 MED ORDER — HEPARIN SODIUM (PORCINE) 5000 UNIT/ML IJ SOLN
5000.0000 [IU] | Freq: Three times a day (TID) | INTRAMUSCULAR | Status: DC
  Administered 2023-06-01 – 2023-06-04 (×8): 5000 [IU] via SUBCUTANEOUS
  Filled 2023-06-01 (×9): qty 1

## 2023-06-01 MED ORDER — LACTATED RINGERS IV SOLN: INTRAVENOUS | Status: DC

## 2023-06-01 MED ORDER — FENTANYL 2500MCG IN NS 250ML (10MCG/ML) PREMIX INFUSION
50.0000 ug/h | INTRAVENOUS | Status: DC
  Administered 2023-06-01: 50 ug/h via INTRAVENOUS
  Administered 2023-06-02: 100 ug/h via INTRAVENOUS
  Filled 2023-06-01 (×2): qty 250

## 2023-06-01 MED ORDER — THIAMINE HCL 100 MG/ML IJ SOLN
100.0000 mg | Freq: Every day | INTRAMUSCULAR | Status: DC
  Administered 2023-06-01 – 2023-06-04 (×4): 100 mg via INTRAVENOUS
  Filled 2023-06-01 (×4): qty 2

## 2023-06-01 MED ORDER — DOCUSATE SODIUM 50 MG/5ML PO LIQD: 100.0000 mg | Freq: Two times a day (BID) | ORAL | Status: DC

## 2023-06-01 MED ORDER — NOREPINEPHRINE 4 MG/250ML-% IV SOLN
2.0000 ug/min | INTRAVENOUS | Status: DC
  Filled 2023-06-01: qty 250

## 2023-06-01 MED ORDER — PROPOFOL 1000 MG/100ML IV EMUL
0.0000 ug/kg/min | INTRAVENOUS | Status: DC
  Administered 2023-06-01 – 2023-06-02 (×2): 20 ug/kg/min via INTRAVENOUS
  Filled 2023-06-01 (×3): qty 100

## 2023-06-01 MED ORDER — ROCURONIUM BROMIDE 10 MG/ML (PF) SYRINGE
70.0000 mg | PREFILLED_SYRINGE | Freq: Once | INTRAVENOUS | Status: DC
  Filled 2023-06-01: qty 10

## 2023-06-01 MED ORDER — FENTANYL CITRATE PF 50 MCG/ML IJ SOSY
50.0000 ug | PREFILLED_SYRINGE | Freq: Once | INTRAMUSCULAR | Status: AC
Start: 2023-06-01 — End: 2023-06-01
  Administered 2023-06-01: 50 ug via INTRAVENOUS
  Filled 2023-06-01: qty 1

## 2023-06-01 MED ORDER — POLYETHYLENE GLYCOL 3350 17 G PO PACK: 17.0000 g | PACK | Freq: Every day | ORAL | Status: DC

## 2023-06-01 MED ORDER — FENTANYL BOLUS VIA INFUSION
50.0000 ug | INTRAVENOUS | Status: DC | PRN
  Administered 2023-06-01 – 2023-06-02 (×3): 100 ug via INTRAVENOUS

## 2023-06-01 MED ORDER — ORAL CARE MOUTH RINSE: 15.0000 mL | OROMUCOSAL | Status: DC | PRN

## 2023-06-01 MED ORDER — CHLORHEXIDINE GLUCONATE CLOTH 2 % EX PADS
6.0000 | MEDICATED_PAD | Freq: Every day | CUTANEOUS | Status: DC
  Administered 2023-06-01 – 2023-06-03 (×3): 6 via TOPICAL

## 2023-06-01 MED ORDER — ORAL CARE MOUTH RINSE
15.0000 mL | OROMUCOSAL | Status: DC
  Administered 2023-06-01 – 2023-06-02 (×10): 15 mL via OROMUCOSAL

## 2023-06-01 MED ORDER — LEVETIRACETAM IN NACL 1000 MG/100ML IV SOLN
1000.0000 mg | Freq: Once | INTRAVENOUS | Status: AC
  Administered 2023-06-01: 1000 mg via INTRAVENOUS
  Filled 2023-06-01: qty 100

## 2023-06-01 MED ORDER — PANTOPRAZOLE SODIUM 40 MG IV SOLR
40.0000 mg | Freq: Every day | INTRAVENOUS | Status: DC
  Administered 2023-06-01: 40 mg via INTRAVENOUS
  Filled 2023-06-01 (×2): qty 10

## 2023-06-01 MED ORDER — ETOMIDATE 2 MG/ML IV SOLN
INTRAVENOUS | Status: DC | PRN
  Administered 2023-06-01: 20 mg via INTRAVENOUS

## 2023-06-01 MED ORDER — ROCURONIUM BROMIDE 10 MG/ML (PF) SYRINGE
PREFILLED_SYRINGE | INTRAVENOUS | Status: DC | PRN
  Administered 2023-06-01: 70 mg via INTRAVENOUS

## 2023-06-01 MED ORDER — POLYETHYLENE GLYCOL 3350 17 G PO PACK: 17.0000 g | PACK | Freq: Every day | ORAL | Status: DC | PRN

## 2023-06-01 MED ORDER — MIDAZOLAM HCL 2 MG/2ML IJ SOLN
1.0000 mg | INTRAMUSCULAR | Status: DC | PRN
  Administered 2023-06-01 – 2023-06-02 (×3): 2 mg via INTRAVENOUS
  Filled 2023-06-01 (×3): qty 2

## 2023-06-01 MED ORDER — FOLIC ACID 5 MG/ML IJ SOLN
1.0000 mg | Freq: Every day | INTRAMUSCULAR | Status: DC
  Administered 2023-06-02 – 2023-06-03 (×3): 1 mg via INTRAVENOUS
  Filled 2023-06-01 (×5): qty 0.2

## 2023-06-01 NOTE — H&P (Signed)
NAME:  Rodney Freeman, MRN:  [id number], DOB:  11-06-1985, LOS: 0 ADMISSION DATE:  06/01/2023, CONSULTATION DATE:  12/19 REFERRING MD:  Dr. Adela Lank, CHIEF COMPLAINT:  seizure   History of Present Illness:  Patient is a 37 year old male with pertinent PMH of seizure disorder on Lamictal presents to [facility] ED on 12/19 with seizures. EMS was called to his home after he apparently had multiple seizures then had another one witnessed by EMS. He is on Lamictal but per pharmacy reports, he has not filled it since June 2024. He also has Levetiracetam listed on med list; however, per notes, does not appear he has taken and [facility] mentions that he had not been taking it as of Dec 2023.  On ED arrival, he was altered, possibly post ictal. Lab work was unremarkable with the exception of EtOH level of 307. Given his ongoing encephalopathy, he required intubation for airway protection. CT head is pending. EDP has also consulted neurology for assistance with LTM and AED's.  He had ED presentation 11/27 for the same. He requested discharge at the time and had informed EDP that he had not taken Lamictal in months because it was not working. CT head that visit was negative for acute process. Lamotrigine and Levetiracetam levels were significantly subtherapeutic, supporting non-compliance.  Pertinent  Medical History   Past Medical History:  Diagnosis Date   Epilepsy (HCC)    Seizures (HCC)     Significant Hospital Events: Including procedures, antibiotic start and stop dates in addition to other pertinent events   12/19 admitted w/ seizure, intubated.  Interim History / Subjective:  Sedated on 40 Propofol. Required increase from 20 as was a bit agitated.  Objective   Blood pressure (!) 144/121, pulse 91, temperature (!) 96.8 F (36 C), temperature source Axillary, resp. rate 18, weight 70 kg, SpO2 100%.    Vent Mode: PRVC FiO2 (%):  [100 %] 100 % Set Rate:  [18 bmp] 18 bmp Vt Set:  [530 mL] 530 mL PEEP:  [5  cmH20] 5 cmH20  No intake or output data in the 24 hours ending 06/01/23 1855 Filed Weights   06/01/23 1832  Weight: 70 kg    Examination: General: Young adult male, resting in bed, in NAD. Neuro: Sedated, not responsive but does move extremities purposefully. HEENT: [place]/AT. Sclerae anicteric. ETT in place. Cardiovascular: RRR, no M/R/G.  Lungs: Respirations even and unlabored.  CTA bilaterally, No W/R/R. Abdomen: BS x 4, soft, NT/ND.  Musculoskeletal: No gross deformities, no edema.  Skin: Multiple tattoos. Intact, warm, no rashes.   Studies/Imaging:  CT head 12/19 > EEG 12/19 >   Assessment & Plan:   Status Epilepticus - long standing hx of seizures with hx of medication non-compliance (Lamotrigine, Levetiracetam, Clonazepam). Acute metabolic encephalopathy - Presumed 2/2 above as well as combination of alcohol intoxication (ethanol 307). - Admit to neuro ICU. - Neuro consulted by EDP, appreciate the assistance. - EEG and AED's per neuro. - F/u on CT head.  - Hold PTA Clonazepam, Escitalopram, Lamotrigine, Levetiracetam (though do not believe had been taking this since 2023). - Seizure precautions. - Thiamine, folic acid, MVI. - Will likely need CIWA vs Phenobarb vs Precedex once extubated and off continuous sedation. - Monitor for signs of withdrawal.  Acute respiratory failure in setting of status epilepticus - s/p intubation. - Full vent support. - Daily SBT. - Wean as mental status allows. - Bronchial hygiene. - Follow CXR intermittently.     Best Practice (right click and "  Reselect all SmartList Selections" daily)   Diet/type: NPO DVT prophylaxis prophylactic heparin  Pressure ulcer(s): N/A GI prophylaxis: PPI Lines: N/A Foley:  Yes, and it is still needed Code Status:  full code Last date of multidisciplinary goals of care discussion: None yet.  Labs   CBC: Recent Labs  Lab 06/01/23 1710  WBC 4.0  NEUTROABS 1.2*  HGB 13.9  HCT 40.0  MCV 92.6   PLT 295    Basic Metabolic Panel: Recent Labs  Lab 06/01/23 1710  NA 141  K 3.9  CL 110  CO2 22  GLUCOSE 87  BUN 6  CREATININE 0.73  CALCIUM 8.6*   GFR: Estimated Creatinine Clearance: 120.5 mL/min (by C-G formula based on SCr of 0.73 mg/dL). Recent Labs  Lab 06/01/23 1710  WBC 4.0    Liver Function Tests: Recent Labs  Lab 06/01/23 1710  AST 44*  ALT 23  ALKPHOS 69  BILITOT 0.4  PROT 6.5  ALBUMIN 4.0   No results for input(s): "LIPASE", "AMYLASE" in the last 168 hours. No results for input(s): "AMMONIA" in the last 168 hours.  ABG    Component Value Date/Time   TCO2 27 09/18/2021 1840     Coagulation Profile: No results for input(s): "INR", "PROTIME" in the last 168 hours.  Cardiac Enzymes: No results for input(s): "CKTOTAL", "CKMB", "CKMBINDEX", "TROPONINI" in the last 168 hours.  HbA1C: No results found for: "HGBA1C"  CBG: Recent Labs  Lab 06/01/23 1647  GLUCAP 88    Review of Systems:   Unable to obtain as pt is encephalopathic.   Past Medical History:  He,  has a past medical history of Epilepsy (HCC) and Seizures (HCC).   Surgical History:   Past Surgical History:  Procedure Laterality Date   ELBOW SURGERY Right      Social History:   reports that he has been smoking cigarettes. He has never used smokeless tobacco. He reports current alcohol use. He reports current drug use. Drug: Marijuana.   Family History:  His family history is not on file.   Allergies No Known Allergies   Home Medications  Prior to Admission medications   Medication Sig Start Date End Date Taking? Authorizing Provider  clonazePAM (KLONOPIN) 0.5 MG tablet Take 1 tablet (0.5 mg total) by mouth 2 (two) times daily for 3 days. Do not drink while taking this 05/11/22 05/14/22  Shaune Pollack, MD  escitalopram (LEXAPRO) 10 MG tablet Take 10 mg by mouth daily. Patient not taking: Reported on 06/02/2022 11/12/20   [provider]  folic acid (FOLVITE)  1 MG tablet Take 1 tablet (1 mg total) by mouth daily. Patient not taking: Reported on 06/02/2022 09/20/21   Leroy Sea, MD  lamoTRIgine (LAMICTAL) 200 MG tablet Take 200 mg by mouth 2 (two) times daily. 04/04/21   [provider]  levETIRAcetam (KEPPRA) 500 MG tablet Take 1 tablet (500 mg total) by mouth 2 (two) times daily. Patient not taking: Reported on 06/02/2022 09/19/21   Leroy Sea, MD  thiamine 100 MG tablet Take 1 tablet (100 mg total) by mouth daily. Patient not taking: Reported on 06/02/2022 09/20/21   Leroy Sea, MD     Critical care time: 35 min.    Rutherford Guys, PA - [facility] For pager details, please see AMION or use Epic chat  After 1900, please call [facility] for cross coverage needs 06/01/2023, 7:30 PM

## 2023-06-01 NOTE — Progress Notes (Signed)
Ceribell placed on pt per MD order.

## 2023-06-01 NOTE — ED Provider Notes (Signed)
[place] EM[facility]GENCY DEPARTMENT AT [facility] Provider Note  HPI   Rodney Freeman is a 37 y.o. male patient with a PMHx of seizure disorder reportedly on Lamictal here today with concern for seizures.  No history is able to be obtained by patient, however EMS states that he reportedly had multiple seizures at home and they witnessed a seizure as well.  They given a total of 10 mg of intramuscular Versed, last dose of 5 mg intramuscular Versed at 1618.  Per pharmacist, patient is prescribed Lamictal, has not filled it since June 2024.  ROS Negative except as per HPI   Medical Decision Making   Upon presentation, the patient is afebrile but hemodynamically stable, he has a GCS of 115, eyes are closed, not talking, but responds to pain full stimuli, he is also having bradypnea  Glucose checked, 88, IV access obtained, ECG shows no signs of ischemia or arrhythmia, we will do a broad altered mental status workup on him including CBC CMP salicylate ethanol acetaminophen UA and drug screen.  Right now I do not think the patient requires a head scan, given did not see evidence of trauma, and patient has a much more likely reason to be altered right now being postictal versus having received multiple doses of IM benzodiazepines.  His pupils are small and eye exam, however right now history is more likely to be benzos rather than opioids.  We have looked at this patient end-tidal, have IV access, and are going to keep a very close eye on this individual.  Saturating around 90% on room air, saturating high 90s on 2 L.  Will keep a close eye on him.  We did give the patient a 1 g dose of Keppra for seizure prophylaxis.  My nurses monitor him very closely his labs are started coming back, he had no major derangement on his metabolic panel other than AST of 44, his CBC showed no signs of leukocytosis anemia or any platelet abnormality.  Salicylate and Tylenol negative.  Right around the time that his  alcohol level had come back very elevated, the patient became sonorous respirations, intermittent spells of apnea, and was no longer protecting his airway.  We proceeded with intubation, that was largely unremarkable.  We gave propofol for sedation after, reach out to neurology and the patient was admitted to the ICU.   No diagnosis found.  @DISPOSITION @  Rx / DC Orders ED Discharge Orders     None        Past Medical History:  Diagnosis Date   Epilepsy (HCC)    Seizures (HCC)    Past Surgical History:  Procedure Laterality Date   ELBOW SURGERY Right    History reviewed. No pertinent family history. Social History   Socioeconomic History   Marital status: Single    Spouse name: Not on file   Number of children: Not on file   Years of education: Not on file   Highest education level: Not on file  Occupational History   Not on file  Tobacco Use   Smoking status: Every Day    Current packs/day: 1.00    Types: Cigarettes   Smokeless tobacco: Never  Vaping Use   Vaping status: Every Day  Substance and Sexual Activity   Alcohol use: Yes   Drug use: Yes    Types: Marijuana   Sexual activity: Not on file  Other Topics Concern   Not on file  Social History Narrative   Not  on file   Social Drivers of Health   Financial Resource Strain: Not on file  Food Insecurity: Not on file  Transportation Needs: Not on file  Physical Activity: Not on file  Stress: Not on file  Social Connections: Not on file  Intimate Partner Violence: Not on file     Physical Exam   There were no vitals filed for this visit.  Physical Exam Vitals and nursing note reviewed.  Constitutional:      Appearance: He is well-developed. He is not ill-appearing or toxic-appearing.  HENT:     Head: Normocephalic and atraumatic.     Right Ear: External ear normal.     Left Ear: External ear normal.     Nose: Nose normal.     Mouth/Throat:     Mouth: Mucous membranes are moist.  Eyes:      Comments: Pupils are 2 mm equal and reactive to light bilaterally minimal constriction  Cardiovascular:     Rate and Rhythm: Normal rate.     Pulses: Normal pulses.  Pulmonary:     Breath sounds: No stridor. No wheezing, rhonchi or rales.     Comments: Bradypnea to around 8 Abdominal:     Palpations: Abdomen is soft.     Tenderness: There is no abdominal tenderness. There is no right CVA tenderness or left CVA tenderness.  Musculoskeletal:        General: Normal range of motion.     Cervical back: Normal range of motion and neck supple.  Skin:    General: Skin is warm and dry.     Capillary Refill: Capillary refill takes less than 2 seconds.  Neurological:     Comments: GCS 1 1 5, patient crosses midline to the bilateral upper extremities response to pain in all 4 extremities, eyes closed, not talking      Procedures   If procedures were preformed on this patient, they are listed below:  Procedure Name: Intubation Date/Time: 06/02/2023 12:08 AM  Performed by: Gunnar Bulla, MDPre-anesthesia Checklist: Patient identified, Patient being monitored, Emergency Drugs available, Timeout performed and Suction available Oxygen Delivery Method: Non-rebreather mask Preoxygenation: Pre-oxygenation with 100% oxygen Induction Type: Rapid sequence Laryngoscope Size: Mac and 4 Grade View: Grade I Tube size: 7.5 mm Number of attempts: 1 Airway Equipment and Method: Stylet and Patient positioned with wedge pillow Placement Confirmation: ETT inserted through vocal cords under direct vision, CO2 detector, Positive ETCO2 and Breath sounds checked- equal and bilateral Secured at: 25 cm Tube secured with: ETT holder Comments: Successful intubation, no complications, DL, grade 1 view. Hemodynamics stable.      The patient was seen, evaluated, and treated in conjunction with the attending physician, who voiced agreement in the care provided.  Note generated using Dragon voice dictation software  and may contain dictation errors. Please contact me for any clarification or with any questions.   Electronically signed by:  Osvaldo Shipper, M.D. (PGY-2)    Gunnar Bulla, MD 06/02/23 0010    Melene Plan, DO 06/02/23 1600

## 2023-06-01 NOTE — ED Notes (Signed)
Please call mother [phone]

## 2023-06-01 NOTE — ED Triage Notes (Signed)
Pt was picked up from home for seizure like activity, His mom in [place] called 911 for him. He had a witnessed seizure on scene with medic with no loss of bowel or bladder, and had 8 more subsequent seizures. Pt was combative with medic as well as confused. This is a problem this patient has had historically. He received 10mg  of versed en route and currently is out of restraints sleeping.   Medic vitals   129/88 12rr 80hr 99%ra 127bgl

## 2023-06-01 NOTE — Progress Notes (Signed)
Pt. Transported to 4N15 from room 21 in ED on 100% O2 without any complications with RN and RT. RT assigned to 4N ICU notified of pt. Arrival on unit.

## 2023-06-01 NOTE — Progress Notes (Addendum)
eLink Physician-Brief Progress Note Patient Name: Rodney Freeman DOB: 09/08/1985 MRN: [id number]   Date of Service  06/01/2023  HPI/Events of Note  Patient admitted for uncontrolled seizures, he was intubated for airway protection. He has been hypotensive on sedation. Due to his sedation being lightened to improve his blood pressure he attempted to self extubate.  eICU Interventions  New Patient Evaluation. LR 1000 ml IV fluid bolus x 1, peripheral Norepinephrine gtt ordered for hypotension persisting despite fluid bolus.        Thomasene Lot Holley Wirt 06/01/2023, 11:08 PM

## 2023-06-01 NOTE — ED Notes (Signed)
ED TO INPATIENT HANDOFF REPORT  ED Nurse Name and Phone #: Francene Castle [phone]  S Name/Age/Gender Rodney Freeman 37 y.o. male Room/Bed: 021C/021C  Code Status   Code Status: Full Code  Home/SNF/Other Home Unable to determine mentation status, intubated when I arrived on shift.  Is this baseline?  unknown  Triage Complete: Triage complete  Chief Complaint Status epilepticus (HCC) [G40.901]  Triage Note Pt was picked up from home for seizure like activity, His mom in [place] called 911 for him. He had a witnessed seizure on scene with medic with no loss of bowel or bladder, and had 8 more subsequent seizures. Pt was combative with medic as well as confused. This is a problem this patient has had historically. He received 10mg  of versed en route and currently is out of restraints sleeping.   Medic vitals   129/88 12rr 80hr 99%ra 127bgl   Allergies No Known Allergies  Level of Care/Admitting Diagnosis ED Disposition     ED Disposition  Admit   Condition  --   Comment  Hospital Area: [facility] [[id number]]  Level of Care: ICU [6]  May admit patient to [facility] or [facility] if equivalent level of care is available:: No  Covid Evaluation: Asymptomatic - no recent exposure (last 10 days) testing not required  Diagnosis: Status epilepticus [facility]) [[id number]]  Admitting Physician: PCCM, MD [phone]  Attending Physician: Briant Sites [phone]  Certification:: I certify there are rare and unusual circumstances requiring inpatient admission  Expected Medical Readiness: 06/06/2023          B Medical/Surgery History Past Medical History:  Diagnosis Date   Epilepsy (HCC)    Seizures (HCC)    Past Surgical History:  Procedure Laterality Date   ELBOW SURGERY Right      A IV Location/Drains/Wounds Patient Lines/Drains/Airways Status     Active Line/Drains/Airways     Name Placement date Placement time Site Days   Peripheral IV  06/01/23 20 G Anterior;Proximal;Right Forearm 06/01/23  1708  Forearm  less than 1   Peripheral IV 06/01/23 20 G Left Forearm 06/01/23  1935  Forearm   less than 1   Peripheral IV 06/01/23 18 G Right Antecubital 06/01/23  2018  Antecubital   less than 1   NG/OG Vented/Dual Lumen 16 Fr. Oral External length of tube 65 cm 06/01/23  1850  Oral  less than 1   Urethral Catheter Temperature probe 06/01/23  1937  Temperature probe  less than 1   Airway 7.5 mm 06/01/23  1835  -- less than 1            Intake/Output Last 24 hours  Intake/Output Summary (Last 24 hours) at 06/01/2023 2229 Last data filed at 06/01/2023 2036 Gross per 24 hour  Intake 100 ml  Output 300 ml  Net -200 ml    Labs/Imaging Results for orders placed or performed during the hospital encounter of 06/01/23 (from the past 48 hours)  CBG monitoring, ED     Status: None   Collection Time: 06/01/23  4:47 PM  Result Value Ref Range   Glucose-Capillary 88 70 - 99 mg/dL    Comment: Glucose reference range applies only to samples taken after fasting for at least 8 hours.  Comprehensive metabolic panel     Status: Abnormal   Collection Time: 06/01/23  5:10 PM  Result Value Ref Range   Sodium 141 135 - 145 mmol/L   Potassium 3.9 3.5 - 5.1 mmol/L  Chloride 110 98 - 111 mmol/L   CO2 22 22 - 32 mmol/L   Glucose, Bld 87 70 - 99 mg/dL    Comment: Glucose reference range applies only to samples taken after fasting for at least 8 hours.   BUN 6 6 - 20 mg/dL   Creatinine, Ser 9.56 0.61 - 1.24 mg/dL   Calcium 8.6 (L) 8.9 - 10.3 mg/dL   Total Protein 6.5 6.5 - 8.1 g/dL   Albumin 4.0 3.5 - 5.0 g/dL   AST 44 (H) 15 - 41 U/L   ALT 23 0 - 44 U/L   Alkaline Phosphatase 69 38 - 126 U/L   Total Bilirubin 0.4 <1.2 mg/dL   GFR, Estimated >21 >30 mL/min    Comment: (NOTE) Calculated using the CKD-EPI Creatinine Equation (2021)    Anion gap 9 5 - 15    Comment: Performed at [facility], [address] [place], [place]  [address]  CBC with Differential/Platelet     Status: Abnormal   Collection Time: 06/01/23  5:10 PM  Result Value Ref Range   WBC 4.0 4.0 - 10.5 K/uL   RBC 4.32 4.22 - 5.81 MIL/uL   Hemoglobin 13.9 13.0 - 17.0 g/dL   HCT 46.9 62.9 - 52.8 %   MCV 92.6 80.0 - 100.0 fL   MCH 32.2 26.0 - 34.0 pg   MCHC 34.8 30.0 - 36.0 g/dL   RDW 41.3 24.4 - 01.0 %   Platelets 295 150 - 400 K/uL   nRBC 0.0 0.0 - 0.2 %   Neutrophils Relative % 30 %   Neutro Abs 1.2 (L) 1.7 - 7.7 K/uL   Lymphocytes Relative 59 %   Lymphs Abs 2.4 0.7 - 4.0 K/uL   Monocytes Relative 6 %   Monocytes Absolute 0.2 0.1 - 1.0 K/uL   Eosinophils Relative 3 %   Eosinophils Absolute 0.1 0.0 - 0.5 K/uL   Basophils Relative 2 %   Basophils Absolute 0.1 0.0 - 0.1 K/uL   Immature Granulocytes 0 %   Abs Immature Granulocytes 0.01 0.00 - 0.07 K/uL    Comment: Performed at [facility], [address] [place], [place] [address]  Salicylate level     Status: Abnormal   Collection Time: 06/01/23  5:10 PM  Result Value Ref Range   Salicylate Lvl <7.0 (L) 7.0 - 30.0 mg/dL    Comment: Performed at [facility], [address], [place], [place] [address]  Acetaminophen level     Status: Abnormal   Collection Time: 06/01/23  5:10 PM  Result Value Ref Range   Acetaminophen (Tylenol), Serum <10 (L) 10 - 30 ug/mL    Comment: (NOTE) Therapeutic concentrations vary significantly. A range of 10-30 ug/mL  may be an effective concentration for many patients. However, some  are best treated at concentrations outside of this range. Acetaminophen concentrations >150 ug/mL at 4 hours after ingestion  and >50 ug/mL at 12 hours after ingestion are often associated with  toxic reactions.  Performed at [facility], [address] [place], [place] [address]   Ethanol     Status: Abnormal   Collection Time: 06/01/23  5:10 PM  Result Value Ref Range   Alcohol, Ethyl (B) 307 (HH) <10 mg/dL    Comment: CRITICAL RESULT CALLED TO,  READ BACK BY AND VERIFIED WITH K. CHAPLIN, RN AT 1813 12.19.24 D. BLU (NOTE) Lowest detectable limit for serum alcohol is 10 mg/dL.  For medical  purposes only. Performed at [facility], [address], [place], [place] [address]   I-Stat arterial blood gas, ED     Status: Abnormal   Collection Time: 06/01/23  7:47 PM  Result Value Ref Range   pH, Arterial 7.352 7.35 - 7.45   pCO2 arterial 42.6 32 - 48 mmHg   pO2, Arterial 460 (H) 83 - 108 mmHg   Bicarbonate 23.9 20.0 - 28.0 mmol/L   TCO2 25 22 - 32 mmol/L   O2 Saturation 100 %   Acid-base deficit 2.0 0.0 - 2.0 mmol/L   Sodium 143 135 - 145 mmol/L   Potassium 4.0 3.5 - 5.1 mmol/L   Calcium, Ion 1.18 1.15 - 1.40 mmol/L   HCT 41.0 39.0 - 52.0 %   Hemoglobin 13.9 13.0 - 17.0 g/dL   Patient temperature 28.4 F    Sample type ARTERIAL   Urinalysis, w/ Reflex to Culture (Infection Suspected) -Urine, Clean Catch     Status: Abnormal   Collection Time: 06/01/23  7:50 PM  Result Value Ref Range   Specimen Source URINE, CLEAN CATCH    Color, Urine STRAW (A) YELLOW   APPearance CLEAR CLEAR   Specific Gravity, Urine 1.004 (L) 1.005 - 1.030   pH 5.0 5.0 - 8.0   Glucose, UA NEGATIVE NEGATIVE mg/dL   Hgb urine dipstick NEGATIVE NEGATIVE   Bilirubin Urine NEGATIVE NEGATIVE   Ketones, ur NEGATIVE NEGATIVE mg/dL   Protein, ur NEGATIVE NEGATIVE mg/dL   Nitrite NEGATIVE NEGATIVE   Leukocytes,Ua NEGATIVE NEGATIVE   RBC / HPF 0-5 0 - 5 RBC/hpf   WBC, UA 0-5 0 - 5 WBC/hpf    Comment:        Reflex urine culture not performed if WBC <=10, OR if Squamous epithelial cells >5. If Squamous epithelial cells >5 suggest recollection.    Bacteria, UA RARE (A) NONE SEEN   Squamous Epithelial / HPF 0-5 0 - 5 /HPF   Mucus PRESENT     Comment: Performed at [facility], [address] [place], [place] [address]  Rapid urine drug screen (hospital performed)     Status: Abnormal   Collection Time: 06/01/23  7:50 PM  Result Value Ref Range    Opiates NONE DETECTED NONE DETECTED   Cocaine NONE DETECTED NONE DETECTED   Benzodiazepines POSITIVE (A) NONE DETECTED   Amphetamines NONE DETECTED NONE DETECTED   Tetrahydrocannabinol POSITIVE (A) NONE DETECTED   Barbiturates NONE DETECTED NONE DETECTED    Comment: (NOTE) DRUG SCREEN FOR MEDICAL PURPOSES ONLY.  IF CONFIRMATION IS NEEDED FOR ANY PURPOSE, NOTIFY LAB WITHIN 5 DAYS.  LOWEST DETECTABLE LIMITS FOR URINE DRUG SCREEN Drug Class                     Cutoff (ng/mL) Amphetamine and metabolites    1000 Barbiturate and metabolites    200 Benzodiazepine                 200 Opiates and metabolites        300 Cocaine and metabolites        300 THC                            50 Performed at [facility], [address], [place], [place] [address]    No results found.  Pending Labs [employer] (From admission, onward)     Start  Ordered   06/02/23 0500  Triglycerides  (propofol (DIPRIVAN))  Every 72 hours,   R     Comments: While on propofol (DIPRIVAN)    06/01/23 1836   06/02/23 0500  CBC  Tomorrow morning,   R        06/01/23 1928   06/02/23 0500  Basic metabolic panel  Tomorrow morning,   R        06/01/23 1928   06/02/23 0500  Blood gas, arterial  Tomorrow morning,   R        06/01/23 1928   06/02/23 0500  Magnesium  Tomorrow morning,   R        06/01/23 1928   06/02/23 0500  Phosphorus  Tomorrow morning,   R        06/01/23 1928   06/01/23 1926  HIV Antibody (routine testing w rflx)  (HIV Antibody (Routine testing w reflex) panel)  Once,   R        06/01/23 1928            Vitals/Pain Today's Vitals   06/01/23 2120 06/01/23 2130 06/01/23 2140 06/01/23 2150  BP: (!) 84/65 (!) 89/68 93/73 106/87  Pulse: (!) 55 (!) 52 (!) 51 (!) 57  Resp: 18 18 18 18   Temp: (!) 97.3 F (36.3 C) (!) 97.3 F (36.3 C) (!) 97.1 F (36.2 C) (!) 97.1 F (36.2 C)  TempSrc:      SpO2: 98% 100% 100% 100%  Weight:      PainSc:        Isolation  Precautions No active isolations  Medications Medications  etomidate (AMIDATE) injection (20 mg Intravenous Given 06/01/23 1829)  rocuronium (ZEMURON) injection (70 mg Intravenous Given 06/01/23 1829)  etomidate (AMIDATE) injection 20 mg (0 mg Intravenous Hold 06/01/23 2015)  rocuronium (ZEMURON) injection 70 mg (has no administration in time range)  propofol (DIPRIVAN) 1000 MG/100ML infusion (20 mcg/kg/min  70 kg Intravenous Rate/Dose Change 06/01/23 2201)  0.9 %  sodium chloride infusion (250 mLs Intravenous New Bag/Given 06/01/23 1934)  fentaNYL in NS (71mcg/ml) infusion-PREMIX (75 mcg/hr Intravenous Rate/Dose Change 06/01/23 2139)  fentaNYL (SUBLIMAZE) bolus via infusion 50-100 mcg (100 mcg Intravenous Bolus from Bag 06/01/23 2200)  docusate (COLACE) 50 MG/5ML liquid 100 mg (has no administration in time range)  polyethylene glycol (MIRALAX / GLYCOLAX) packet 17 g (has no administration in time range)  midazolam (VERSED) injection 1-2 mg (2 mg Intravenous Given 06/01/23 2207)  heparin injection 5,000 Units (has no administration in time range)  pantoprazole (PROTONIX) injection 40 mg (has no administration in time range)  lactated ringers infusion ( Intravenous New Bag/Given 06/01/23 2037)  thiamine (VITAMIN B1) injection 100 mg (100 mg Intravenous Given 06/01/23 2013)  folic acid injection 1 mg (has no administration in time range)  levETIRAcetam (KEPPRA) IVPB 1000 mg/100 mL premix (0 mg Intravenous Stopped 06/01/23 1744)  fentaNYL (SUBLIMAZE) injection 50 mcg (50 mcg Intravenous Given 06/01/23 1935)  levETIRAcetam (KEPPRA) IVPB 1000 mg/100 mL premix (0 mg Intravenous Stopped 06/01/23 2036)    Mobility walks     Focused Assessments    R Recommendations: See Admitting Provider Note  Report given to:   Additional Notes:

## 2023-06-01 NOTE — ED Notes (Signed)
Pt breathing worsening. EDP advised

## 2023-06-01 NOTE — Progress Notes (Signed)
Pt. Transported from room 21 to CT2 and back on 100% O2 without any complications with RN and RT.

## 2023-06-01 NOTE — Consult Note (Signed)
NEUROLOGY CONSULT NOTE   Date of service: June 01, 2023 Patient Name: Rodney Freeman MRN:  [id number] DOB:  1986/05/13 Chief Complaint: "Seizures, intubated" Requesting Provider: Briant Sites, Rodney Freeman  History of Present Illness  Rodney Freeman is a 37 y.o. male with hx of epilepsy on Lamictal with medication fill hx concerning for non compliance, who is brought in to the ED by EMS for multiple seizures. EMS gave him Versed 5mg  IM x 2 for persistent seizure activity enroute. Clinical seizure activity resolved in the ED. He was given Keppra 1000mg  IV once in the ED and observed for few hours with persistent obtunded mentation. Developed sonorous respirations and was intubated.  His EtOH levels are elevated to 307 here. CT Head with no acute intracranial abnormalities. No fever, vitals with mild tachycardia. No significant abnormalities noted on chemistry or cbc.  He is intubated and unable to provide any history.    ROS  Unable to ascertain due to intubated and sedated.  Past History   Past Medical History:  Diagnosis Date   Epilepsy (HCC)    Seizures (HCC)     Past Surgical History:  Procedure Laterality Date   ELBOW SURGERY Right     Family History: History reviewed. No pertinent family history.  Social History  reports that he has been smoking cigarettes. He has never used smokeless tobacco. He reports current alcohol use. He reports current drug use. Drug: Marijuana.  No Known Allergies  Medications   Current Facility-Administered Medications:    0.9 %  sodium chloride infusion, 250 mL, Intravenous, Continuous, Rodney Freeman, Dan, Rodney Freeman, Last Rate: 10 mL/hr at 06/01/23 1934, 250 mL at 06/01/23 1934   docusate (COLACE) 50 MG/5ML liquid 100 mg, 100 mg, Per Tube, BID, Rodney Freeman, Rodney Freeman, Rodney Freeman   etomidate (AMIDATE) injection 20 mg, 20 mg, Intravenous, Once, Rodney Freeman, Dan, Rodney Freeman   etomidate (AMIDATE) injection, , Intravenous, Code/Trauma/Sedation Med, Rodney Freeman, Dan, Rodney Freeman, 20 mg at 06/01/23 1829    fentaNYL (SUBLIMAZE) bolus via infusion 50-100 mcg, 50-100 mcg, Intravenous, Q15 min PRN, Rodney Bulla, Rodney Freeman   fentaNYL in NS (28mcg/ml) infusion-PREMIX, 50-200 mcg/hr, Intravenous, Continuous, Rodney Bulla, Rodney Freeman   folic acid injection 1 mg, 1 mg, Intravenous, Daily, Rodney Freeman, Rodney Freeman, Rodney Freeman   heparin injection 5,000 Units, 5,000 Units, Subcutaneous, Q8H, Rodney Freeman, Rodney Freeman, Rodney Freeman   lactated ringers infusion, , Intravenous, Continuous, Rodney Freeman, Rodney Freeman, Rodney Freeman   levETIRAcetam (KEPPRA) IVPB 1000 mg/100 mL premix, 1,000 mg, Intravenous, Once, Rodney Blinks, Rodney Freeman   midazolam (VERSED) injection 1-2 mg, 1-2 mg, Intravenous, Q1H PRN, Rodney Freeman, Rodney Freeman, Rodney Freeman   pantoprazole (PROTONIX) injection 40 mg, 40 mg, Intravenous, QHS, Rodney Freeman, Rodney Freeman, Rodney Freeman   polyethylene glycol (MIRALAX / GLYCOLAX) packet 17 g, 17 g, Per Tube, Daily, Rodney Freeman, Rodney Freeman, Rodney Freeman   propofol (DIPRIVAN) 1000 MG/100ML infusion, 0-50 mcg/kg/min, Intravenous, Continuous, Rodney Freeman, Dan, Rodney Freeman, Last Rate: 16.8 mL/hr at 06/01/23 1947, 40 mcg/kg/min at 06/01/23 1947   rocuronium (ZEMURON) injection 70 mg, 70 mg, Intravenous, Once, Rodney Freeman, Dan, Rodney Freeman   rocuronium [facility]) injection, , Intravenous, Code/Trauma/Sedation Med, Rodney Plan, Rodney Freeman, 70 mg at 06/01/23 1829   thiamine (VITAMIN B1) injection 100 mg, 100 mg, Intravenous, Daily, Rodney Freeman, Rodney Freeman, Rodney Freeman  Current Outpatient Medications:    clonazePAM (KLONOPIN) 0.5 MG tablet, Take 1 tablet (0.5 mg total) by mouth 2 (two) times daily for 3 days. Rodney Freeman not drink while taking this, Disp: 6 tablet, Rfl: 0   escitalopram (LEXAPRO) 10 MG tablet, Take 10 mg by mouth daily. (Patient not  taking: Reported on 06/02/2022), Disp: , Rfl:    folic acid (FOLVITE) 1 MG tablet, Take 1 tablet (1 mg total) by mouth daily. (Patient not taking: Reported on 06/02/2022), Disp: 30 tablet, Rfl: 0   lamoTRIgine (LAMICTAL) 200 MG tablet, Take 200 mg by mouth 2 (two) times daily., Disp: , Rfl:    levETIRAcetam (KEPPRA) 500 MG tablet, Take 1 tablet  (500 mg total) by mouth 2 (two) times daily. (Patient not taking: Reported on 06/02/2022), Disp: 60 tablet, Rfl: 0   thiamine 100 MG tablet, Take 1 tablet (100 mg total) by mouth daily. (Patient not taking: Reported on 06/02/2022), Disp: 30 tablet, Rfl: 0  Vitals   Vitals:   2023-06-23 1915 2023/06/23 1925 06/23/23 1930 2023/06/23 1946  BP: 113/84 (!) 121/92 117/87 99/74  Pulse: 87 94 83 73  Resp: 18 (!) 22 (!) 28   Temp:      TempSrc:      SpO2: 100% 100% 100%   Weight:        Body mass index is 24.17 kg/m.  Physical Exam   General: Laying comfortably in bed; in no acute distress.  HENT: Normal oropharynx and mucosa. Normal external appearance of ears and nose.  Neck: Supple, no pain or tenderness  CV: No JVD. No peripheral edema.  Pulmonary: Symmetric Chest rise. Normal respiratory effort.  Abdomen: Soft to touch, non-tender.  Ext: No cyanosis, edema, or deformity  Skin: No rash. Normal palpation of skin.   Musculoskeletal: Normal digits and nails by inspection. No clubbing.   Neurologic Examination  Mental status/Cognition: obtunded, intubated, no response to voice or loud clap. Moves his head left to right with nares stimulation. Speech/language: intubated, mute, no attempts to communicate, does not follow commands Cranial nerves:   CN II Pupils equal and reactive to light, unable to assess for VF deficits.   CN III,IV,VI EOM intact to dolls eyes   CN V Corneals intact BL   CN VII Symmetric facial grimace to nares stimulation   CN VIII Does not turn head towards speech   CN IX & X Cough intact, gag intact   CN XI Head midline   CN XII midline tongue but does not protrude on command.   Sensory/Motor:  Muscle bulk: normal, tone flaccid in all extremities. No response to proximal pinch in any of the extremities.  Coordination/Complex Motor:  Unable to assess.  Labs/Imaging/Neurodiagnostic studies   CBC:  Recent Labs  Lab June 23, 2023 1710 06/23/23 1947  WBC 4.0  --    NEUTROABS 1.2*  --   HGB 13.9 13.9  HCT 40.0 41.0  MCV 92.6  --   PLT 295  --    Basic Metabolic Panel:  Lab Results  Component Value Date   NA 143 Jun 23, 2023   K 4.0 06-23-2023   CO2 22 06/23/23   GLUCOSE 87 2023/06/23   BUN 6 23-Jun-2023   CREATININE 0.73 2023/06/23   CALCIUM 8.6 (L) 2023-06-23   GFRNONAA >60 06-23-2023   GFRAA >60 09/15/2016   Lipid Panel: No results found for: "LDLCALC" HgbA1c: No results found for: "HGBA1C" Urine Drug Screen:     Component Value Date/Time   LABOPIA NONE DETECTED 06/02/2022 2011   LABOPIA NONE DETECTED 09/19/2021 0226   COCAINSCRNUR NONE DETECTED 06/02/2022 2011   LABBENZ NONE DETECTED 06/02/2022 2011   LABBENZ POSITIVE (A) 09/19/2021 0226   AMPHETMU NONE DETECTED 06/02/2022 2011   AMPHETMU NONE DETECTED 09/19/2021 0226   THCU POSITIVE (A) 06/02/2022 2011   THCU  POSITIVE (A) 09/19/2021 0226   LABBARB NONE DETECTED 06/02/2022 2011   LABBARB NONE DETECTED 09/19/2021 0226    Alcohol Level     Component Value Date/Time   ETH 307 (HH) 06/01/2023 1710   INR  Lab Results  Component Value Date   INR 0.9 09/18/2021   APTT No results found for: "APTT" AED levels:  Lab Results  Component Value Date   LAMOTRIGINE <1.0 (L) 05/10/2023   LEVETIRACETA <2.0 (L) 05/10/2023    CT Head without contrast(Personally reviewed): CTH was negative for a large hypodensity concerning for a large territory infarct or hyperdensity concerning for an ICH  Neurodiagnostics Ceribell EEG:  pending  ASSESSMENT   Ashe Kopper is a 37 y.o. male with hx of epilepsy on Lamictal with medication fill hx concerning for non compliance, who is brought in to the ED by EMS for multiple seizures. EMS gave him Versed 5mg  IM x 2 for persistent seizure activity enroute. Clinical seizure activity resolved in the ED. He was given Keppra 1000mg  IV once in the ED and observed for few hours with persistent obtunded mentation. Developed sonorous respirations and was  intubated.  No clinical seizure activity noted on my evaluation.  RECOMMENDATIONS  - ceribell EEG overnight to rule out subclinical seizures vs status. - transition to cEEG in AM. - no driving for 6months. Has to be seizure free for 6 months before he can resume driving. - continue Keppra 1000mg  BID for now. Will need to Rodney Freeman gradual uptitration of Lamictal and use Keppra as a bridge as listed below. Will need to start when he is able to tolerate PO intake.  Lamotrigine Keppra  Week 1 and 2 25mg  daily 1000mg  BID  Week 3 and 4 50mg  daily 1000mg  BID  Week 5 100mg  daily 1000mg  BID  Week 6 50mg  in AM, 100mg  in PM 1000mg  BID  Week 7 100mg  in AM and 100mg  in PM 1000mg  BID  Week 8 150mg  in AM and 150mg  in PM 1000mg  BID  Week 9 and onwards 200mg  BID Stop Keppra    ______________________________________________________________________  This patient is critically ill and at significant risk of neurological worsening, death and care requires constant monitoring of vital signs, hemodynamics,respiratory and cardiac monitoring, neurological assessment, discussion with family, other specialists and medical decision making of high complexity. I spent 60 minutes of neurocritical care time  in the care of  this patient. This was time spent independent of any time provided by nurse practitioner or PA.  Rodney Freeman [facility] 06/02/2023  2:32 AM  Signed, Rodney Blinks, Rodney Freeman [facility]

## 2023-06-02 ENCOUNTER — Inpatient Hospital Stay (HOSPITAL_COMMUNITY): Payer: 59

## 2023-06-02 DIAGNOSIS — G40901 Epilepsy, unspecified, not intractable, with status epilepticus: Secondary | ICD-10-CM | POA: Diagnosis not present

## 2023-06-02 DIAGNOSIS — Z91148 Patient's other noncompliance with medication regimen for other reason: Secondary | ICD-10-CM

## 2023-06-02 DIAGNOSIS — J9601 Acute respiratory failure with hypoxia: Secondary | ICD-10-CM | POA: Diagnosis not present

## 2023-06-02 DIAGNOSIS — F10929 Alcohol use, unspecified with intoxication, unspecified: Secondary | ICD-10-CM | POA: Diagnosis not present

## 2023-06-02 LAB — BASIC METABOLIC PANEL
Anion gap: 10 (ref 5–15)
BUN: 5 mg/dL — ABNORMAL LOW (ref 6–20)
CO2: 23 mmol/L (ref 22–32)
Calcium: 8.7 mg/dL — ABNORMAL LOW (ref 8.9–10.3)
Chloride: 111 mmol/L (ref 98–111)
Creatinine, Ser: 0.93 mg/dL (ref 0.61–1.24)
GFR, Estimated: >60 mL/min (ref >60)
Glucose, Bld: 67 mg/dL — ABNORMAL LOW (ref 70–99)
Potassium: 3.9 mmol/L (ref 3.5–5.1)
Sodium: 144 mmol/L (ref 135–145)

## 2023-06-02 LAB — CK: Total CK: 132 U/L (ref 49–397)

## 2023-06-02 LAB — CBC
HCT: 37.3 % — ABNORMAL LOW (ref 39.0–52.0)
Hemoglobin: 12.9 g/dL — ABNORMAL LOW (ref 13.0–17.0)
MCH: 32.4 pg (ref 26.0–34.0)
MCHC: 34.6 g/dL (ref 30.0–36.0)
MCV: 93.7 fL (ref 80.0–100.0)
Platelets: 249 10*3/uL (ref 150–400)
RBC: 3.98 MIL/uL — ABNORMAL LOW (ref 4.22–5.81)
RDW: 12.4 % (ref 11.5–15.5)
WBC: 11.8 10*3/uL — ABNORMAL HIGH (ref 4.0–10.5)
nRBC: 0 % (ref 0.0–0.2)

## 2023-06-02 LAB — PHOSPHORUS: Phosphorus: 4 mg/dL (ref 2.5–4.6)

## 2023-06-02 LAB — HIV ANTIBODY (ROUTINE TESTING W REFLEX): HIV Screen 4th Generation wRfx: NONREACTIVE

## 2023-06-02 LAB — TRIGLYCERIDES: Triglycerides: 125 mg/dL (ref <150)

## 2023-06-02 LAB — MRSA NEXT GEN BY PCR, NASAL: MRSA by PCR Next Gen: NOT DETECTED

## 2023-06-02 LAB — MAGNESIUM: Magnesium: 1.7 mg/dL (ref 1.7–2.4)

## 2023-06-02 MED ORDER — LORAZEPAM 2 MG/ML IJ SOLN
1.0000 mg | INTRAMUSCULAR | Status: DC | PRN
  Administered 2023-06-02 (×2): 2 mg via INTRAVENOUS
  Filled 2023-06-02 (×2): qty 1

## 2023-06-02 MED ORDER — LAMOTRIGINE 25 MG PO TABS
25.0000 mg | ORAL_TABLET | Freq: Every day | ORAL | Status: DC
  Administered 2023-06-02: 25 mg
  Filled 2023-06-02: qty 1

## 2023-06-02 MED ORDER — SODIUM CHLORIDE 0.9 % IV SOLN
12.5000 mg | Freq: Four times a day (QID) | INTRAVENOUS | Status: DC | PRN
  Filled 2023-06-02: qty 0.5

## 2023-06-02 MED ORDER — LAMOTRIGINE 25 MG PO TABS: 50.0000 mg | ORAL_TABLET | Freq: Every day | ORAL | Status: DC

## 2023-06-02 MED ORDER — LORAZEPAM 2 MG/ML IJ SOLN
INTRAMUSCULAR | Status: AC
  Administered 2023-06-02: 2 mg
  Filled 2023-06-02: qty 1

## 2023-06-02 MED ORDER — LACTATED RINGERS IV BOLUS
500.0000 mL | Freq: Once | INTRAVENOUS | Status: AC
  Administered 2023-06-02: 500 mL via INTRAVENOUS

## 2023-06-02 MED ORDER — ONDANSETRON HCL 4 MG/2ML IJ SOLN
INTRAMUSCULAR | Status: AC
  Administered 2023-06-02: 4 mg
  Filled 2023-06-02: qty 2

## 2023-06-02 MED ORDER — LACTATED RINGERS IV SOLN: INTRAVENOUS | Status: AC

## 2023-06-02 MED ORDER — LAMOTRIGINE 100 MG PO TABS: 100.0000 mg | ORAL_TABLET | Freq: Every day | ORAL | Status: DC

## 2023-06-02 MED ORDER — ADULT MULTIVITAMIN W/MINERALS CH: 1.0000 | ORAL_TABLET | Freq: Every day | ORAL | Status: DC

## 2023-06-02 MED ORDER — DEXMEDETOMIDINE HCL IN NACL 400 MCG/100ML IV SOLN
0.0000 ug/kg/h | INTRAVENOUS | Status: DC
  Administered 2023-06-02: 0.4 ug/kg/h via INTRAVENOUS

## 2023-06-02 MED ORDER — PHENOBARBITAL SODIUM 65 MG/ML IJ SOLN
65.0000 mg | Freq: Once | INTRAMUSCULAR | Status: AC
  Administered 2023-06-02: 65 mg via INTRAVENOUS
  Filled 2023-06-02: qty 1

## 2023-06-02 MED ORDER — LORAZEPAM 1 MG PO TABS: 1.0000 mg | ORAL_TABLET | ORAL | Status: DC | PRN

## 2023-06-02 MED ORDER — MIDAZOLAM HCL 2 MG/2ML IJ SOLN: 1.0000 mg | INTRAMUSCULAR | Status: DC | PRN

## 2023-06-02 MED ORDER — LEVETIRACETAM IN NACL 1000 MG/100ML IV SOLN
1000.0000 mg | Freq: Two times a day (BID) | INTRAVENOUS | Status: DC
  Administered 2023-06-02 – 2023-06-03 (×3): 1000 mg via INTRAVENOUS
  Filled 2023-06-02 (×3): qty 100

## 2023-06-02 MED ORDER — ORAL CARE MOUTH RINSE: 15.0000 mL | OROMUCOSAL | Status: DC | PRN

## 2023-06-02 MED ORDER — MAGNESIUM SULFATE 2 GM/50ML IV SOLN
2.0000 g | Freq: Once | INTRAVENOUS | Status: AC
  Administered 2023-06-02: 2 g via INTRAVENOUS
  Filled 2023-06-02: qty 50

## 2023-06-02 MED ORDER — DEXMEDETOMIDINE HCL IN NACL 400 MCG/100ML IV SOLN
0.0000 ug/kg/h | INTRAVENOUS | Status: DC
  Filled 2023-06-02: qty 100

## 2023-06-02 NOTE — Progress Notes (Addendum)
Subjective: Extubated this morning.  States he ran out of lamotrigine and had contacted his doctor for refills.  But cannot tell me who his neurologist is.  ROS: negative except above  Examination  Vital signs in last 24 hours: Temp:  [96.8 F (36 C)-98.4 F (36.9 C)] 98.4 F (36.9 C) (12/20 0800) Pulse Rate:  [50-114] 85 (12/20 0800) Resp:  [10-28] 13 (12/20 0800) BP: (84-182)/(59-131) 127/76 (12/20 0800) SpO2:  [96 %-100 %] 97 % (12/20 0800) FiO2 (%):  [40 %-100 %] 40 % (12/20 0728) Weight:  [59.8 kg-70 kg] 59.8 kg (12/20 0500)  General: lying in bed, NAD Neuro: Drowsy, requires repeated tactile and verbal stimulation to keep him awake, when awake, is able to tell me his name and that he is at a hospital, able to follow simple commands and name objects, cranial nerves appear grossly intact, 5/5 in all 4 extremities  Basic Metabolic Panel: Recent Labs  Lab 06/01/23 1710 06/01/23 1947 06/02/23 0559  NA 141 143 144  K 3.9 4.0 3.9  CL 110  --  111  CO2 22  --  23  GLUCOSE 87  --  67*  BUN 6  --  5*  CREATININE 0.73  --  0.93  CALCIUM 8.6*  --  8.7*  MG  --   --  1.7  PHOS  --   --  4.0    CBC: Recent Labs  Lab 06/01/23 1710 06/01/23 1947 06/02/23 0559  WBC 4.0  --  11.8*  NEUTROABS 1.2*  --   --   HGB 13.9 13.9 12.9*  HCT 40.0 41.0 37.3*  MCV 92.6  --  93.7  PLT 295  --  249     Coagulation Studies: No results for input(s): "LABPROT", "INR" in the last 72 hours.  Imaging personally reviewed  CT head without contrast 05/22/2023: No acute abnormality.  ASSESSMENT AND PLAN: 37 year old male with history of epilepsy who presented with status epilepticus in the setting of medication noncompliance  Epilepsy with status epilepticus -Due to medication noncompliance. -No further seizures overnight. -Patient states he ran out of his prescription.  However from chart review it looks like he has not seen a neurologist in over a year (used to follow-up with [facility]  neurology) and has been to emergency room multiple times with breakthrough seizures.  Most recent admission was 05/10/2023 at [facility] ER when he left AGAINST MEDICAL ADVICE.  Recommendations -I tried to address the importance of medication compliance with patient.  However he was too drowsy -As patient has been off of lamotrigine for more than 5 days, will continue Keppra 1000mg  BID for now. Will need to do gradual uptitration of Lamictal and use Keppra as a bridge as listed below. Will need to start when he is able to tolerate PO intake.   Lamotrigine Keppra  Week 1 and 2 25mg  daily 1000mg  BID  Week 3 and 4 50mg  daily 1000mg  BID  Week 5 100mg  daily 1000mg  BID  Week 6 50mg  in AM, 100mg  in PM 500mg  BID  Week 7 100mg  in AM and 100mg  in PM 500mg  BID  Week 8 150mg  in AM and 150mg  in PM 250mg  BID  Week 9 and onwards 200mg  BID Stop Keppra  -Continue seizure precautions including do not drive -As needed IV benzos for clinical seizures -Defer management of other comorbidities per primary team -Recommend follow-up with neurology in 3 months after discharge  Seizure precautions: Per [facility] statutes, patients with seizures are not  allowed to drive until they have been seizure-free for six months and cleared by a physician    Use caution when using heavy equipment or power tools. Avoid working on ladders or at heights. Take showers instead of baths. Ensure the water temperature is not too high on the home water heater. Do not go swimming alone. Do not lock yourself in a room alone (i.e. bathroom). When caring for infants or small children, sit down when holding, feeding, or changing them to minimize risk of injury to the child in the event you have a seizure. Maintain good sleep hygiene. Avoid alcohol.    If patient has another seizure, call 911 and bring them back to the ED if: A.  The seizure lasts longer than 5 minutes.      B.  The patient doesn't wake shortly after the seizure or has new  problems such as difficulty seeing, speaking or moving following the seizure C.  The patient was injured during the seizure D.  The patient has a temperature over 102 F (39C) E.  The patient vomited during the seizure and now is having trouble breathing    During the Seizure   - First, ensure adequate ventilation and place patients on the floor on their left side  Loosen clothing around the neck and ensure the airway is patent. If the patient is clenching the teeth, do not force the mouth open with any object as this can cause severe damage - Remove all items from the surrounding that can be hazardous. The patient may be oblivious to what's happening and may not even know what he or she is doing. If the patient is confused and wandering, either gently guide him/her away and block access to outside areas - Reassure the individual and be comforting - Call 911. In most cases, the seizure ends before EMS arrives. However, there are cases when seizures may last over 3 to 5 minutes. Or the individual may have developed breathing difficulties or severe injuries. If a pregnant patient or a person with diabetes develops a seizure, it is prudent to call an ambulance.     After the Seizure (Postictal Stage)   After a seizure, most patients experience confusion, fatigue, muscle pain and/or a headache. Thus, one should permit the individual to sleep. For the next few days, reassurance is essential. Being calm and helping reorient the person is also of importance.   Most seizures are painless and end spontaneously. Seizures are not harmful to others but can lead to complications such as stress on the lungs, brain and the heart. Individuals with prior lung problems may develop labored breathing and respiratory distress.    I have spent a total of 36   minutes with the patient reviewing hospital notes,  test results, labs and examining the patient as well as establishing an assessment and plan that was  discussed personally with the patient.  > 50% of time was spent in direct patient care.       Lindie Spruce Epilepsy [facility] For questions after 5pm please refer to AMION to reach the Neurologist on call

## 2023-06-02 NOTE — Progress Notes (Signed)
Pt's mother Irene Limbo updated by RN and CCM at bedside.

## 2023-06-02 NOTE — Procedures (Signed)
Patient Name: [name]  MRN: [id number]  Epilepsy Attending: Charlsie Quest  Referring Physician/Provider: Dr Erick Blinks Duration: 06/01/2023 2352 to 06/02/2023 0750  Patient history: 37 y.o. male with hx of epilepsy on Lamictal with medication fill hx concerning for non compliance, who is brought in to the ED by EMS for multiple seizures. EEG to evaluate for seizure  Level of alertness: comatose  AEDs during EEG study: Propofol, LEV, Phenobarb, Ativan  Technical aspects: This EEG was obtained using a 10 lead EEG system positioned circumferentially without any parasagittal coverage (rapid EEG). Computer selected EEG is reviewed as  well as background features and all clinically significant events.  Description: EEG showed continuous generalized 6-10Hz  theta-alpha activity admixed with 15 to 18 Hz beta activity distributed symmetrically and diffusely. Hyperventilation and photic stimulation were not performed.     ABNORMALITY - Continuous slow, generalized  IMPRESSION: This limited ceribell EEG is suggestive of severe diffuse encephalopathy, likely related to sedation. No seizures or epileptiform discharges were seen throughout the recording.  Rodney Freeman Rodney Freeman

## 2023-06-02 NOTE — Plan of Care (Signed)

## 2023-06-02 NOTE — Progress Notes (Signed)
eLink Physician-Brief Progress Note Patient Name: Anterio Demlow DOB: 21-Oct-1985 MRN: [id number]   Date of Service  06/02/2023  HPI/Events of Note  I spoke with patient's mum, Ms. Kennith Center (phone number [phone]), I updated her on her son's condition and answered all her questions.  eICU Interventions  See above.        Bannon Giammarco U Shaelin Lalley 06/02/2023, 2:10 AM

## 2023-06-02 NOTE — H&P (Signed)
Transition of Care [facility]) - Inpatient Brief Assessment   Patient Details  Name: Alegend Lieske MRN: [id number] Date of Birth: [birth year]  Transition of Care [facility]) CM/SW Contact:    Glennon Mac, RN Phone Number: 06/02/2023, 3:45 PM   Clinical Narrative: Patient is a 37 year old male with pertinent PMH of seizure disorder on Lamictal presents to [facility] ED on 12/19 with seizures. EMS was called to his home after he apparently had multiple seizures then had another one witnessed by EMS. He is on Lamictal but per pharmacy reports, he has not filled it since June 2024.  PTA, pt independent and working; he lives at home alone. Patient extubated today; TOC will follow for home needs.   PCP: [facility]   Transition of Care Asessment: Insurance and Status: Insurance coverage has been reviewed Patient has primary care physician: Yes Home environment has been reviewed: Lives alone Prior level of function:: Independent Prior/Current Home Services: No current home services Social Drivers of Health Review: SDOH reviewed no interventions necessary Readmission risk has been reviewed: Yes Transition of care needs: transition of care needs identified, TOC will continue to follow  Quintella Baton, RN, BSN  Trauma/Neuro ICU Case Manager [phone]

## 2023-06-02 NOTE — Progress Notes (Signed)
RT NOTE: attempted SBT on patient this AM on CPAP/PSV of 15/5 at 0724 however patient's RR decreased to 5.  Placed patient back on full support ventilator settings and is tolerating well at this time.  Will continue to monitor.

## 2023-06-02 NOTE — Procedures (Signed)
Extubation Procedure Note  Patient Details:   Name: Rodney Freeman DOB: 1985/06/28 MRN: [id number]   Airway Documentation:    Vent end date: 06/02/23 Vent end time: 0745   Evaluation  O2 sats: stable throughout Complications: No apparent complications Patient did tolerate procedure well. Bilateral Breath Sounds: Clear   Yes  Pt extubated to 2L [place] per MD order. Pt tolerated well. Cuff leak present, no stridor noted, RN at bedside, CCM MD ware, RT will monitor as needed.   Thornell Mule 06/02/2023, 7:56 AM

## 2023-06-02 NOTE — ED Provider Notes (Incomplete)
[place] EMERGENCY DEPARTMENT AT [facility] Provider Note  HPI   Rodney Freeman is a 37 y.o. male patient with a PMHx of seizure disorder reportedly on Lamictal here today with concern for seizures.  No history is able to be obtained by patient, however EMS states that he reportedly had multiple seizures at home and they witnessed a seizure as well.  They given a total of 10 mg of intramuscular Versed, last dose of 5 mg intramuscular Versed at 1618.  Per pharmacist, patient is prescribed Lamictal, has not filled it since June 2024.    ROS Negative except as per HPI   Medical Decision Making   Upon presentation, the patient is afebrile but hemodynamically stable, he has a GCS of 115, eyes are closed, not talking, but responds to pain full stimuli, he is also having bradypnea   Glucose checked, 88, IV access obtained, ECG shows no signs of ischemia or arrhythmia, we will do a broad altered mental status workup on him including CBC CMP salicylate ethanol acetaminophen UA and drug screen.  Right now I do not think the patient requires a head scan, given did not see evidence of trauma, and patient has a much more likely reason to be altered right now being postictal versus having received multiple doses of IM benzodiazepines.  His pupils are small and eye exam, however right now history is more likely to be benzos rather than opioids.  We have looked at this patient end-tidal, have IV access, and are going to keep a very close eye on this individual.  Saturating around 90% on room air, saturating high 90s on 2 L.  Will keep a close eye on him.  We did give the patient a 1 g dose of Keppra for seizure prophylaxis.  My nurses monitor him very closely his labs are started coming back, he had no major derangement on his metabolic panel other than AST of 44, his CBC showed no signs of leukocytosis anemia or any platelet abnormality.  Salicylate and Tylenol negative.  Right around the time that  his alcohol level had come back very elevated, the patient became sonorous respirations, intermittent spells of apnea, and was no longer protecting his airway.      No diagnosis found.  @DISPOSITION @  Rx / DC Orders ED Discharge Orders     None        Past Medical History:  Diagnosis Date  . Epilepsy (HCC)   . Seizures (HCC)    Past Surgical History:  Procedure Laterality Date  . ELBOW SURGERY Right    History reviewed. No pertinent family history. Social History   Socioeconomic History  . Marital status: Single    Spouse name: Not on file  . Number of children: Not on file  . Years of education: Not on file  . Highest education level: Not on file  Occupational History  . Not on file  Tobacco Use  . Smoking status: Every Day    Current packs/day: 1.00    Types: Cigarettes  . Smokeless tobacco: Never  Vaping Use  . Vaping status: Every Day  Substance and Sexual Activity  . Alcohol use: Yes  . Drug use: Yes    Types: Marijuana  . Sexual activity: Not on file  Other Topics Concern  . Not on file  Social History Narrative  . Not on file   Social Drivers of Health   Financial Resource Strain: Not on file  Food Insecurity: Not on file  Transportation Needs: Not on file  Physical Activity: Not on file  Stress: Not on file  Social Connections: Not on file  Intimate Partner Violence: Not on file     Physical Exam   There were no vitals filed for this visit.  Physical Exam Vitals and nursing note reviewed.  Constitutional:      Appearance: He is well-developed. He is not ill-appearing or toxic-appearing.  HENT:     Head: Normocephalic and atraumatic.     Right Ear: External ear normal.     Left Ear: External ear normal.     Nose: Nose normal.     Mouth/Throat:     Mouth: Mucous membranes are moist.  Eyes:     Comments: Pupils are 2 mm equal and reactive to light bilaterally minimal constriction  Cardiovascular:     Rate and Rhythm: Normal  rate.     Pulses: Normal pulses.  Pulmonary:     Breath sounds: No stridor. No wheezing, rhonchi or rales.     Comments: Bradypnea to around 8 Abdominal:     Palpations: Abdomen is soft.     Tenderness: There is no abdominal tenderness. There is no right CVA tenderness or left CVA tenderness.  Musculoskeletal:        General: Normal range of motion.     Cervical back: Normal range of motion and neck supple.  Skin:    General: Skin is warm and dry.     Capillary Refill: Capillary refill takes less than 2 seconds.  Neurological:     Comments: GCS 1 1 5, patient crosses midline to the bilateral upper extremities response to pain in all 4 extremities, eyes closed, not talking      Procedures   If procedures were preformed on this patient, they are listed below:  Procedures  The patient was seen, evaluated, and treated in conjunction with the attending physician, who voiced agreement in the care provided.  Note generated using Dragon voice dictation software and may contain dictation errors. Please contact me for any clarification or with any questions.   Electronically signed by:  Osvaldo Shipper, M.D. (PGY-2)

## 2023-06-02 NOTE — Progress Notes (Signed)
Notified MD of minimal hourly urine output and low BP despite 1,000 mL bolus of LR.  See new orders.

## 2023-06-02 NOTE — Progress Notes (Addendum)
NAME:  Rodney Freeman, MRN:  [id number], DOB:  Nov 08, 1985, LOS: 1 ADMISSION DATE:  06/01/2023, CONSULTATION DATE:  12/19 REFERRING MD:  Dr. Adela Lank, CHIEF COMPLAINT:  seizure   History of Present Illness:  Patient is a 37 year old male with pertinent PMH of seizure disorder on Lamictal presents to [facility] ED on 12/19 with seizures. EMS was called to his home after he apparently had multiple seizures then had another one witnessed by EMS. He is on Lamictal but per pharmacy reports, he has not filled it since June 2024. He also has Levetiracetam listed on med list; however, per notes, does not appear he has taken and [facility] mentions that he had not been taking it as of Dec 2023.  On ED arrival, he was altered, possibly post ictal. Lab work was unremarkable with the exception of EtOH level of 307. Given his ongoing encephalopathy, he required intubation for airway protection. CT head is pending. EDP has also consulted neurology for assistance with LTM and AED's.  He had ED presentation 11/27 for the same. He requested discharge at the time and had informed EDP that he had not taken Lamictal in months because it was not working. CT head that visit was negative for acute process. Lamotrigine and Levetiracetam levels were significantly subtherapeutic, supporting non-compliance.  Pertinent  Medical History   Past Medical History:  Diagnosis Date   Epilepsy (HCC)    Seizures (HCC)     Significant Hospital Events: Including procedures, antibiotic start and stop dates in addition to other pertinent events   12/19 admitted w/ seizure, intubated.  Interim History / Subjective:  On sedation, patient is now awake and following commands.  Objective   Blood pressure 95/62, pulse (!) 53, temperature (!) 97.5 F (36.4 C), resp. rate 18, weight 59.8 kg, SpO2 100%.    Vent Mode: PRVC FiO2 (%):  [40 %-100 %] 40 % Set Rate:  [17 bmp-18 bmp] 18 bmp Vt Set:  [530 mL] 530 mL PEEP:  [5 cmH20] 5 cmH20 Plateau  Pressure:  [15 cmH20-29 cmH20] 18 cmH20   Intake/Output Summary (Last 24 hours) at 06/02/2023 0739 Last data filed at 06/02/2023 0700 Gross per 24 hour  Intake 2480.15 ml  Output 1180 ml  Net 1300.15 ml   Filed Weights   06/01/23 1832 06/01/23 2300 06/02/23 0500  Weight: 70 kg 59.8 kg 59.8 kg    Examination: General: Young adult male, resting in bed, in NAD. Neuro: Sitting bolt upright, following commands in all limbs HEENT:  ETT in place.  Cardiovascular: HS normal.  Lungs: Chest clear. Biting on tube occasionally. Good tidal volumes but still triggering apnea at times  Abdomen: soft Musculoskeletal: No gross deformities, no edema.  Skin: Multiple tattoos. Intact, warm, no rashes.   Studies/Imaging:  Mild hypoglycemia BMET WBC 11.8 CT head normal.  UDS: THC+, BDZ + Assessment & Plan:   Status epilepticus secondary to noncompliance with seizure medication and early alcohol withdrawal. Acute encephalopathy secondary to residual alcohol intoxication, postictal state, cannabis use. Mechanical ventilation for airway protection.  Plan:  -Patient is now awake and following commands.  Unlikely having nonconvulsive seizures at this time and no longer appears postictal. -Stop all sedation and extubate. -Treat seizures as they occur. -Resume home Keppra -Monitor for alcohol withdrawal.  Once IV sedatives washout, we will consider low-dose oral phenobarbital taper.  Best Practice (right click and "Reselect all SmartList Selections" daily)   Diet/type: NPO-swallow evaluation per protocol once extubated DVT prophylaxis prophylactic heparin  Pressure ulcer(s): N/A  GI prophylaxis: PPI Lines: N/A Foley:  Yes, and it is still needed Code Status:  full code Last date of multidisciplinary goals of care discussion: None yet.  CRITICAL CARE Performed by: Lynnell Catalan   Total critical care time: 35 minutes  Critical care time was exclusive of separately billable procedures and  treating other patients.  Critical care was necessary to treat or prevent imminent or life-threatening deterioration.  Critical care was time spent personally by me on the following activities: development of treatment plan with patient and/or surrogate as well as nursing, discussions with consultants, evaluation of patient's response to treatment, examination of patient, obtaining history from patient or surrogate, ordering and performing treatments and interventions, ordering and review of laboratory studies, ordering and review of radiographic studies, pulse oximetry, re-evaluation of patient's condition and participation in multidisciplinary rounds.  Lynnell Catalan, MD [facility] ICU Physician [facility]  Critical Care  Pager: [phone] Mobile: [phone] After hours: [phone].

## 2023-06-03 DIAGNOSIS — K920 Hematemesis: Secondary | ICD-10-CM | POA: Diagnosis not present

## 2023-06-03 DIAGNOSIS — G40901 Epilepsy, unspecified, not intractable, with status epilepticus: Secondary | ICD-10-CM | POA: Diagnosis not present

## 2023-06-03 DIAGNOSIS — D509 Iron deficiency anemia, unspecified: Secondary | ICD-10-CM | POA: Diagnosis not present

## 2023-06-03 DIAGNOSIS — K625 Hemorrhage of anus and rectum: Secondary | ICD-10-CM

## 2023-06-03 DIAGNOSIS — F10929 Alcohol use, unspecified with intoxication, unspecified: Secondary | ICD-10-CM | POA: Diagnosis not present

## 2023-06-03 DIAGNOSIS — J9601 Acute respiratory failure with hypoxia: Secondary | ICD-10-CM | POA: Diagnosis not present

## 2023-06-03 LAB — VITAMIN B12: Vitamin B-12: 219 pg/mL (ref 180–914)

## 2023-06-03 LAB — IRON AND TIBC
Iron: 65 ug/dL (ref 45–182)
Saturation Ratios: 20 % (ref 17.9–39.5)
TIBC: 333 ug/dL (ref 250–450)
UIBC: 268 ug/dL

## 2023-06-03 LAB — FERRITIN: Ferritin: 648 ng/mL — ABNORMAL HIGH (ref 24–336)

## 2023-06-03 LAB — FOLATE: Folate: 15 ng/mL (ref >5.9)

## 2023-06-03 MED ORDER — LAMOTRIGINE 25 MG PO TABS: 50.0000 mg | ORAL_TABLET | Freq: Every day | ORAL | Status: DC

## 2023-06-03 MED ORDER — ADULT MULTIVITAMIN W/MINERALS CH: 1.0000 | ORAL_TABLET | Freq: Every day | ORAL | Status: DC

## 2023-06-03 MED ORDER — LAMOTRIGINE 25 MG PO TABS: 25.0000 mg | ORAL_TABLET | Freq: Every day | ORAL | Status: DC

## 2023-06-03 MED ORDER — LEVETIRACETAM 500 MG PO TABS
1000.0000 mg | ORAL_TABLET | Freq: Two times a day (BID) | ORAL | Status: DC
Start: 2023-06-03 — End: 2023-06-04
  Administered 2023-06-03 – 2023-06-04 (×2): 1000 mg via ORAL
  Filled 2023-06-03 (×3): qty 2

## 2023-06-03 MED ORDER — LAMOTRIGINE 100 MG PO TABS: 100.0000 mg | ORAL_TABLET | Freq: Every day | ORAL | Status: DC

## 2023-06-03 MED ORDER — LAMOTRIGINE 25 MG PO TABS
25.0000 mg | ORAL_TABLET | Freq: Every day | ORAL | Status: DC
  Administered 2023-06-03 – 2023-06-04 (×2): 25 mg via ORAL
  Filled 2023-06-03 (×3): qty 1

## 2023-06-03 MED ORDER — PANTOPRAZOLE SODIUM 40 MG PO TBEC
40.0000 mg | DELAYED_RELEASE_TABLET | Freq: Every day | ORAL | Status: DC
  Administered 2023-06-03 – 2023-06-04 (×2): 40 mg via ORAL
  Filled 2023-06-03 (×2): qty 1

## 2023-06-03 NOTE — Progress Notes (Signed)
To Room via W/C. Pt A/O X4, resp even, non labored. Oriented to room. Encouraged to call for any needs/concerns

## 2023-06-03 NOTE — Consult Note (Addendum)
Consultation  Referring Provider:   [facility] Dr. Denese Killings Primary Care Physician:  [facility] Primary Gastroenterologist:  Gentry Fitz       Reason for Consultation: Hematemesis/rectal bleeding     DOA: 06/01/2023         Hospital Day: 3         HPI:   Rodney Freeman is a 37 y.o. male with past medical history significant for seizure disorder.   Presents to the ER with multiple seizures 1 witnessed by EMS, altered mental status on arrival possible postictal versus alcoholic 300 Patient required intubation to protect airway, CT head unremarkable. Patient states he has not been taking Lamictal have been trying to get into primary care to have it renewed.  Work up notable for  WBC 11.8, Hgb 12.9, MCV normal 93, platelets normal 249 CPK 132 Potassium 3.9, glucose low at 67, BUN 5 without any elevation, creatinine 0.93 Triglycerides 125  Patient initially brought here for altered mental status but reports episodes of hematemesis and rectal bleeding so GI was consulted. No family is at bedside, patient states his mother just left and they are planning on leaving soon as he leaves the hospital to go to [place] where they are from. Patient states he had episode of vomiting and large-volume bright red blood in 2017 while he was in [place] no endoscopic evaluation was told he had hemorrhoids.  Since that time he has had intermittent bright red blood per rectum with nausea and vomiting.  Can have rectal bleeding every few months but lately he has had over 10 episodes of what he calls large-volume bright red blood per rectum over the past 6 weeks.  He is also had loose stools with lower abdominal discomfort.  Loose stools are once to twice a day.  Denies fever or chills or weight loss. For bloody emesis he says this happens at least twice a week will sometimes will just have nausea vomiting of old food versus some spots of blood versus moderate volume bright red blood.  He denies  GERD, dysphagia he has had decreased diet and occasionally will eat food and just not be able to eat after that.  Does have some epigastric discomfort. Patient states NSAIDs are rare 1-2 times a month for headache. Patient does smoke cigarettes. Patient has been chronic marijuana user 1-2 times daily since the age of 78. Patient states alcohol is very rare for him and only drinks holidays, states last alcohol was weeks or months ago but denies any recent alcohol use.  Alcohol level on admission was 300.   I now abnormal ED labs: Abnormal Labs Reviewed  COMPREHENSIVE METABOLIC PANEL - Abnormal; Notable for the following components:      Result Value   Calcium 8.6 (*)    AST 44 (*)    All other components within normal limits  CBC WITH DIFFERENTIAL/PLATELET - Abnormal; Notable for the following components:   Neutro Abs 1.2 (*)    All other components within normal limits  SALICYLATE LEVEL - Abnormal; Notable for the following components:   Salicylate Lvl <7.0 (*)    All other components within normal limits  ACETAMINOPHEN LEVEL - Abnormal; Notable for the following components:   Acetaminophen (Tylenol), Serum <10 (*)    All other components within normal limits  URINALYSIS, W/ REFLEX TO CULTURE (INFECTION SUSPECTED) - Abnormal; Notable for the following components:   Color, Urine STRAW (*)    Specific Gravity, Urine 1.004 (*)  Bacteria, UA RARE (*)    All other components within normal limits  RAPID URINE DRUG SCREEN, HOSP PERFORMED - Abnormal; Notable for the following components:   Benzodiazepines POSITIVE (*)    Tetrahydrocannabinol POSITIVE (*)    All other components within normal limits  ETHANOL - Abnormal; Notable for the following components:   Alcohol, Ethyl (B) 307 (*)    All other components within normal limits  CBC - Abnormal; Notable for the following components:   WBC 11.8 (*)    RBC 3.98 (*)    Hemoglobin 12.9 (*)    HCT 37.3 (*)    All other components within  normal limits  BASIC METABOLIC PANEL - Abnormal; Notable for the following components:   Glucose, Bld 67 (*)    BUN 5 (*)    Calcium 8.7 (*)    All other components within normal limits  I-STAT ARTERIAL BLOOD GAS, ED - Abnormal; Notable for the following components:   pO2, Arterial 460 (*)    All other components within normal limits    Past Medical History:  Diagnosis Date   Epilepsy (HCC)    Seizures (HCC)     Surgical History:  He  has a past surgical history that includes Elbow surgery (Right). Family History:  His family history is not on file. Social History:   reports that he has been smoking cigarettes. He has never used smokeless tobacco. He reports current alcohol use. He reports current drug use. Drug: Marijuana.  Prior to Admission medications   Medication Sig Start Date End Date Taking? Authorizing Provider  clonazePAM (KLONOPIN) 0.5 MG tablet Take 1 tablet (0.5 mg total) by mouth 2 (two) times daily for 3 days. Do not drink while taking this 05/11/22 05/14/22  Shaune Pollack, MD  escitalopram (LEXAPRO) 10 MG tablet Take 10 mg by mouth daily. Patient not taking: Reported on 06/02/2022 11/12/20   [provider]    Current Facility-Administered Medications  Medication Dose Route Frequency Provider Last Rate Last Admin   Chlorhexidine Gluconate Cloth 2 % PADS 6 each  6 each Topical Daily Briant Sites, DO   6 each at 06/03/23 1025   folic acid injection 1 mg  1 mg Intravenous Daily Desai, Rahul P, PA-C   1 mg at 06/03/23 1100   heparin injection 5,000 Units  5,000 Units Subcutaneous Q8H Desai, Rahul P, PA-C   5,000 Units at 06/03/23 1406   lamoTRIgine (LAMICTAL) tablet 25 mg  25 mg Oral Daily Reome, Earle J, RPH   25 mg at 06/03/23 1409   Followed by   Melene Muller ON 06/15/2023] lamoTRIgine (LAMICTAL) tablet 50 mg  50 mg Oral Daily Reome, Earle J, RPH       Followed by   Melene Muller ON 06/29/2023] lamoTRIgine (LAMICTAL) tablet 100 mg  100 mg Oral Daily Reome, Earle J,  RPH       levETIRAcetam (KEPPRA) tablet 1,000 mg  1,000 mg Oral BID Lynnell Catalan, MD   1,000 mg at 06/03/23 1408   Oral care mouth rinse  15 mL Mouth Rinse PRN Agarwala, Daleen Bo, MD       pantoprazole (PROTONIX) EC tablet 40 mg  40 mg Oral Daily Agarwala, Daleen Bo, MD   40 mg at 06/03/23 1409   promethazine (PHENERGAN) 12.5 mg in sodium chloride 0.9 % 50 mL IVPB  12.5 mg Intravenous Q6H PRN Agarwala, Daleen Bo, MD       thiamine (VITAMIN B1) injection 100 mg  100 mg Intravenous Daily Desai, Rahul P,  PA-C   100 mg at 06/03/23 1001    Allergies as of 06/01/2023   (No Known Allergies)    Review of Systems:    Constitutional: No weight loss, fever, chills, weakness or fatigue HEENT: Eyes: No change in vision               Ears, Nose, Throat:  No change in hearing or congestion Skin: No rash or itching Cardiovascular: No chest pain, chest pressure or palpitations   Respiratory: No SOB or cough Gastrointestinal: See HPI and otherwise negative Genitourinary: No dysuria or change in urinary frequency Neurological: No headache, dizziness or syncope Musculoskeletal: No new muscle or joint pain Hematologic: No bleeding or bruising Psychiatric: No history of depression or anxiety     Physical Exam:  Vital signs in last 24 hours: Temp:  [98.6 F (37 C)-99.5 F (37.5 C)] 98.6 F (37 C) (12/21 0300) Pulse Rate:  [50-87] 68 (12/21 1400) Resp:  [11-24] 19 (12/21 1400) BP: (97-151)/(66-89) 124/84 (12/21 1300) SpO2:  [90 %-100 %] 94 % (12/21 1400) Weight:  [60 kg] 60 kg (12/21 0612) Last BM Date : 06/01/23 Last BM recorded by nurses in past 5 days No data recorded  General:   well developed male in no acute distress Head:  Normocephalic and atraumatic. Eyes: sclerae anicteric,conjunctive pink  Heart:  regular rate and rhythm, no murmurs or gallops Pulm: Clear anteriorly; no wheezing Abdomen:  Soft, Non-distended AB, Active bowel sounds. mild tenderness  suprapubic . Without guarding and Without  rebound, No organomegaly appreciated. Extremities:  Without edema. Msk:  Symmetrical without gross deformities. Peripheral pulses intact.  Neurologic:  Alert and  oriented x4;  No focal deficits.  Skin:   Dry and intact without significant lesions or rashes. Psychiatric:  Cooperative. Normal mood and affect.  LAB RESULTS: Recent Labs    06/01/23 1710 06/01/23 1947 06/02/23 0559  WBC 4.0  --  11.8*  HGB 13.9 13.9 12.9*  HCT 40.0 41.0 37.3*  PLT 295  --  249   BMET Recent Labs    06/01/23 1710 06/01/23 1947 06/02/23 0559  NA 141 143 144  K 3.9 4.0 3.9  CL 110  --  111  CO2 22  --  23  GLUCOSE 87  --  67*  BUN 6  --  5*  CREATININE 0.73  --  0.93  CALCIUM 8.6*  --  8.7*   LFT Recent Labs    06/01/23 1710  PROT 6.5  ALBUMIN 4.0  AST 44*  ALT 23  ALKPHOS 69  BILITOT 0.4   PT/INR No results for input(s): "LABPROT", "INR" in the last 72 hours.  STUDIES: DG Chest Port 1 View Result Date: 06/02/2023 CLINICAL DATA:  Endotracheal tube, seizure. EXAM: PORTABLE CHEST 1 VIEW COMPARISON:  Yesterday FINDINGS: Endotracheal tube with tip between the clavicular heads and carina. An enteric tube at least reaches the stomach. There is no edema, consolidation, effusion, or pneumothorax. Normal heart size and mediastinal contours. IMPRESSION: No evidence of active disease.  Located hardware. Electronically Signed   By: Tiburcio Pea M.D.   On: 06/02/2023 06:55   DG Abd 1 View Result Date: 06/02/2023 CLINICAL DATA:  Orogastric tube placement EXAM: ABDOMEN - 1 VIEW COMPARISON:  None Available. FINDINGS: Orogastric tube tip overlies the expected distal body of the stomach. Nonobstructive bowel gas pattern. No free intraperitoneal gas. IMPRESSION: 1. Orogastric tube tip within the distal body of the stomach. Electronically Signed   By: Lyda Kalata.D.  On: 06/02/2023 01:35   DG Chest Portable 1 View Result Date: 06/01/2023 CLINICAL DATA:  Intubated seizures EXAM: PORTABLE CHEST  1 VIEW COMPARISON:  05/11/2022 FINDINGS: Endotracheal tube tip is about 3.1 cm superior to the carina. Enteric tube tip below the diaphragm but incompletely visualized. No acute airspace disease. Normal cardiac size. No pneumothorax IMPRESSION: Endotracheal tube tip about 3.1 cm superior to the carina. No acute airspace disease. Electronically Signed   By: Jasmine Pang M.D.   On: 06/01/2023 21:48   CT Head Wo Contrast Result Date: 06/01/2023 CLINICAL DATA:  Altered mental status and seizure EXAM: CT HEAD WITHOUT CONTRAST TECHNIQUE: Contiguous axial images were obtained from the base of the skull through the vertex without intravenous contrast. RADIATION DOSE REDUCTION: This exam was performed according to the departmental dose-optimization program which includes automated exposure control, adjustment of the mA and/or kV according to patient size and/or use of iterative reconstruction technique. COMPARISON:  None Available. FINDINGS: Brain: No evidence of acute infarction, hemorrhage, hydrocephalus, extra-axial collection or mass lesion/mass effect. Vascular: No hyperdense vessel or unexpected calcification. Skull: Normal. Negative for fracture or focal lesion. Sinuses/Orbits: Mild frontal and anterior ethmoid sinus mucosal thickening. Normal orbits. Other: None IMPRESSION: No acute intracranial abnormality. Electronically Signed   By: Deatra Robinson M.D.   On: 06/01/2023 20:13    Impression/Plan   37 year old male with history of epilepsy initially presented to the ER with epilepsy, not taking his medications regularly, intubated for airway protection with AMS. Patient currently extubated and was complaining of history of hematemesis and rectal bleeding. From discussion patient has longstanding history of intermittent rectal bleeding with hematemesis.Patient's HD stable, has a mild anemia at 11.8 normocytic. Potential alcohol use though he denies this ethanol was 300 when he was admitted. Cannabinoid use  daily since age of 74  With the length of his symptoms diff diagnosis includes peptic ulcer disease related alcohol use, esophagitis, Mallory-Weiss tear, IBS, hemorrhoids, need to rule out IBD/malignancy though with this presentation would be less likely,. It was suggested EGD and colonoscopy in the hospital this admission to the patient with recent seizure activity as any further endoscopic evaluation would have to be in the hospital for the next 3 months, patient is currently hesitant at this time. And his mother plan ongoing back to [place] after some of the hospitalization. Declines rectal exam, offered to talk with his mother but he states he is uncertain how to get hold of her. -At this time we will check iron, ferritin B12 folate to evaluate for IDA -Suggest PPI least once daily inpatient and outpatient for gastric protection -Supportive care with antiemetics, IV fluids -Needs alcohol and cannabinoid cessation, patient may have cyclic cannabinoid hyperemesis with chronic marijuana use. -Would suggest EGD colonoscopy either during this admission or outpatient with GI.  Status epilepticus Likely from known seizure disorder and off medications, possible from ethanol/intoxication On medications, doing well CT head negative Due to recent seizure activity would need endoscopic evaluation in the hospital setting in the next 3 months, if this were to be pursued, can be done this admission or outpatient.  Principal Problem:   Status epilepticus (HCC)    LOS: 2 days   Thank you for your kind consultation, we will continue to follow.   Doree Albee  06/03/2023, 2:09 PM    [facility] Attending   I have seen the patient in conjunction with Ms. Steffanie Dunn.  I agree with the Advanced Practitioner's note, impression and recommendations with the  following additions:  We have recommended an EGD and colonoscopy to evaluate his hematemesis and rectal bleeding and other gastrointestinal  symptoms and signs.  He was unable to decide at this time.  We will follow-up to see if he has changed his mind and workup anemia as above.  Note there are significant ongoing substance use/abuse issues and alcohol level does not correlate with his reported alcohol use.  Iva Boop, MD, [facility] See Loretha Stapler on call - gastroenterology for best contact person 06/03/2023 3:14 PM

## 2023-06-03 NOTE — Progress Notes (Signed)
NAME:  Rodney Freeman, MRN:  [id number], DOB:  1985-07-02, LOS: 2 ADMISSION DATE:  06/01/2023, CONSULTATION DATE:  12/19 REFERRING MD:  Dr. Adela Lank, CHIEF COMPLAINT:  seizure   History of Present Illness:  Patient is a 37 year old male with pertinent PMH of seizure disorder on Lamictal presents to [facility] ED on 12/19 with seizures. EMS was called to his home after he apparently had multiple seizures then had another one witnessed by EMS. He is on Lamictal but per pharmacy reports, he has not filled it since June 2024. He also has Levetiracetam listed on med list; however, per notes, does not appear he has taken and [facility] mentions that he had not been taking it as of Dec 2023.  On ED arrival, he was altered, possibly post ictal. Lab work was unremarkable with the exception of EtOH level of 307. Given his ongoing encephalopathy, he required intubation for airway protection. CT head is pending. EDP has also consulted neurology for assistance with LTM and AED's.  He had ED presentation 11/27 for the same. He requested discharge at the time and had informed EDP that he had not taken Lamictal in months because it was not working. CT head that visit was negative for acute process. Lamotrigine and Levetiracetam levels were significantly subtherapeutic, supporting non-compliance.  Pertinent  Medical History   Past Medical History:  Diagnosis Date   Epilepsy (HCC)    Seizures (HCC)     Significant Hospital Events: Including procedures, antibiotic start and stop dates in addition to other pertinent events   12/19 admitted w/ seizure, intubated. 12/20 no further seizures, extubated.  Initially agitated.  Settled promptly after phenobarbital load.  Interim History / Subjective:  Has been calm overnight.  Did not require CIWA Ativan. Reports variable appetite and passage of blood in his stools.  He occasionally vomits blood.  Objective   Blood pressure 129/82, pulse 74, temperature 98.6 F (37 C), resp.  rate 15, weight 60 kg, SpO2 92%.        Intake/Output Summary (Last 24 hours) at 06/03/2023 1204 Last data filed at 06/03/2023 1000 Gross per 24 hour  Intake 2592.61 ml  Output 1620 ml  Net 972.61 ml   Filed Weights   06/01/23 2300 06/02/23 0500 06/03/23 0612  Weight: 59.8 kg 59.8 kg 60 kg    Examination: General: Young adult male, resting in bed, in NAD. Neuro: Sitting calmly in bed.  Normally conversant.  No focal deficits.  No tremors. HEENT: No pressure injury. Cardiovascular: HS normal.  Lungs: Chest clear.  No distress. Abdomen: soft Musculoskeletal: No gross deformities, no edema.  No evidence of trauma.  Studies/Imaging:  Mild hypoglycemia BMET WBC 11.8 CT head normal.  UDS: THC+, BDZ + Assessment & Plan:   Status epilepticus secondary to noncompliance with seizure medication and early alcohol withdrawal - no further seizures Acute encephalopathy secondary to residual alcohol intoxication, postictal state, cannabis use - sensorium clear. Mechanical ventilation for airway protection - now extubated  Plan:  -Continue current anticonvulsant regimen.  Will need outpatient neurology follow-up.  Needs to be discharged with adequate refills to bridge him. -No further signs of alcohol withdrawal. Stop CIWA as has not triggered in over 24h -Have emphasized the importance of complete abstinence.  Needs counseling and structure. -Advance diet.  Progressive ambulation. -GI consultation for appetite issues and possible GI bleeding. -Ready to transfer.  Medications reconciled.  Best Practice (right click and "Reselect all SmartList Selections" daily)   Diet/type: Regular diet. DVT prophylaxis prophylactic heparin  Pressure ulcer(s): N/A GI prophylaxis: switch to oral PPI for possible GI bleed/dyspepsia Lines: N/A Foley: Remove Code Status:  full code Last date of multidisciplinary goals of care discussion: Advised patient of above and also spoken to his mother who  plans on bringing him back to [place] to live with her.   Lynnell Catalan, MD [facility] ICU Physician [facility]  Pager: [phone] Mobile: [phone] After hours: [phone].

## 2023-06-04 ENCOUNTER — Other Ambulatory Visit: Payer: Self-pay

## 2023-06-04 DIAGNOSIS — K625 Hemorrhage of anus and rectum: Secondary | ICD-10-CM | POA: Diagnosis not present

## 2023-06-04 DIAGNOSIS — D509 Iron deficiency anemia, unspecified: Secondary | ICD-10-CM | POA: Diagnosis not present

## 2023-06-04 DIAGNOSIS — F10929 Alcohol use, unspecified with intoxication, unspecified: Secondary | ICD-10-CM | POA: Diagnosis not present

## 2023-06-04 DIAGNOSIS — J9601 Acute respiratory failure with hypoxia: Secondary | ICD-10-CM | POA: Diagnosis not present

## 2023-06-04 DIAGNOSIS — G40901 Epilepsy, unspecified, not intractable, with status epilepticus: Secondary | ICD-10-CM | POA: Diagnosis not present

## 2023-06-04 DIAGNOSIS — K920 Hematemesis: Secondary | ICD-10-CM | POA: Diagnosis not present

## 2023-06-04 MED ORDER — PANTOPRAZOLE SODIUM 40 MG PO TBEC: 40.0000 mg | DELAYED_RELEASE_TABLET | Freq: Every day | ORAL | 1 refills | Status: AC

## 2023-06-04 MED ORDER — LEVETIRACETAM 1000 MG PO TABS: 1000.0000 mg | ORAL_TABLET | Freq: Two times a day (BID) | ORAL | 1 refills | Status: AC

## 2023-06-04 MED ORDER — LAMOTRIGINE 25 MG PO TABS: ORAL_TABLET | ORAL | 0 refills | Status: AC

## 2023-06-04 NOTE — Plan of Care (Signed)

## 2023-06-04 NOTE — Discharge Summary (Signed)
Physician Discharge Summary  Patient ID: Rodney Freeman MRN: [id number] DOB/AGE: 07/15/1985 37 y.o.  Admit date: 06/01/2023 Discharge date: 06/04/2023  Admission Diagnoses: Status epilepticus  Discharge Diagnoses:  Principal Problem:   Status epilepticus [facility])   Discharged Condition: good  Hospital Course: Patient is a 37 year old male with pertinent PMH of seizure disorder on Lamictal presents to [facility] ED on 12/19 with seizures. EMS was called to his home after he apparently had multiple seizures then had another one witnessed by EMS. He is on Lamictal but per pharmacy reports, he has not filled it since June 2024. He also has Levetiracetam listed on med list; however, per notes, does not appear he has taken and [facility] mentions that he had not been taking it as of Dec 2023.   On ED arrival, he was altered, possibly post ictal. Lab work was unremarkable with the exception of EtOH level of 307. Given his ongoing encephalopathy, he required intubation for airway protection.  CT head was normal. By the time he arrived in the ICU he was awake and following commands. He was extubated and given phenobarbital and started on Precedex for agitation which rapidly subsided. He didn't require any CIWA triggered Ativan thereafter. We restarted his anticonvulsants at their previously prescribed doses. He complained of poor appetite and intermittent passage of blood per rectum and was seen by GI but he eventually deferred investigation opting to return to [place] with his mom and obtain care then. He will be discharged on a PPI. He was advised that strict abstinence from all drugs and alcohol was preferable for someone with a seizure disorder.   Consults: pulmonary/intensive care and GI  Significant Diagnostic Studies: radiology: CT scan: Negative.  Treatments: IV hydration and restarted anticonvulsants.  Discharge Exam: Blood pressure 119/82, pulse (!) 56, temperature 98 F (36.7 C), resp. rate 18, weight 63.7  kg, SpO2 99%. General appearance: alert, cooperative, and no distress Head: Normocephalic, without obvious abnormality, atraumatic Resp: clear to auscultation bilaterally Cardio: regular rate and rhythm, S1, S2 normal, no murmur, click, rub or gallop Neurologic: Grossly normal with no tremor  Disposition:  Discharged to home with mother.    Allergies as of 06/04/2023   No Known Allergies      Medication List     STOP taking these medications    escitalopram 10 MG tablet Commonly known as: LEXAPRO       TAKE these medications    clonazePAM 0.5 MG tablet Commonly known as: KlonoPIN Take 1 tablet (0.5 mg total) by mouth 2 (two) times daily for 3 days. Do not drink while taking this   lamoTRIgine 25 MG tablet Commonly known as: LAMICTAL Take 1 tablet (25 mg total) by mouth daily for 10 days, THEN 2 tablets (50 mg total) daily for 14 days, THEN 4 tablets (100 mg total) daily. Start taking on: June 05, 2023   levETIRAcetam 1000 MG tablet Commonly known as: KEPPRA Take 1 tablet (1,000 mg total) by mouth 2 (two) times daily.   pantoprazole 40 MG tablet Commonly known as: PROTONIX Take 1 tablet (40 mg total) by mouth daily. Start taking on: June 05, 2023         Signed: Lynnell Catalan 06/04/2023, 11:47 AM

## 2023-06-04 NOTE — Progress Notes (Signed)
   Patient Name: Rodney Freeman Date of Encounter: 06/04/2023, 11:06 AM     Assessment and Plan  37 year old male with history of epilepsy initially presented to the ER with epilepsy, not taking his medications regularly, intubated for airway protection with AMS. From discussion patient has longstanding history of intermittent rectal bleeding with hematemesis.Patient's HD stable, has a mild anemia at 11.8 normocytic. Potential alcohol use though he denies this ethanol was 300 when he was admitted. Cannabinoid use daily since age of 39  ------------------------------------------------------------------------------------------------------  Patient is going to [place] with his mother.   GI workup there. Signing off  Subjective  Patient says he is going home.  IV is out and he is in his close and his mother is in the room with him and she says she will have him get gastroenterology evaluation in [place] when they get there.   Objective  BP 119/82 (BP Location: Right Arm)   Pulse (!) 56   Temp 98 F (36.7 C)   Resp 18   Wt 63.7 kg   SpO2 99%   BMI 21.99 kg/m    Recent Labs  Lab 06/01/23 1710 06/01/23 1947 06/02/23 0559  HGB 13.9 13.9 12.9*  HCT 40.0 41.0 37.3*  WBC 4.0  --  11.8*  PLT 295  --  249      Iva Boop, MD, [facility]astroenterology See AMION on call - gastroenterology for best contact person 06/04/2023 11:06 AM

## 2023-06-04 NOTE — Plan of Care (Signed)
  Problem: Education: Goal: Knowledge of General Education information will improve Description: Including pain rating scale, medication(s)/side effects and non-pharmacologic comfort measures Outcome: Adequate for Discharge   Problem: Health Behavior/Discharge Planning: Goal: Ability to manage health-related needs will improve Outcome: Adequate for Discharge   Problem: Clinical Measurements: Goal: Ability to maintain clinical measurements within normal limits will improve Outcome: Adequate for Discharge Goal: Will remain free from infection Outcome: Adequate for Discharge Goal: Diagnostic test results will improve Outcome: Adequate for Discharge Goal: Respiratory complications will improve Outcome: Adequate for Discharge Goal: Cardiovascular complication will be avoided Outcome: Adequate for Discharge   Problem: Activity: Goal: Risk for activity intolerance will decrease Outcome: Adequate for Discharge   Problem: Nutrition: Goal: Adequate nutrition will be maintained Outcome: Adequate for Discharge   Problem: Coping: Goal: Level of anxiety will decrease Outcome: Adequate for Discharge   Problem: Elimination: Goal: Will not experience complications related to bowel motility Outcome: Adequate for Discharge Goal: Will not experience complications related to urinary retention Outcome: Adequate for Discharge   Problem: Pain Management: Goal: General experience of comfort will improve Outcome: Adequate for Discharge   Problem: Safety: Goal: Ability to remain free from injury will improve Outcome: Adequate for Discharge   Problem: Skin Integrity: Goal: Risk for impaired skin integrity will decrease Outcome: Adequate for Discharge   Problem: Safety: Goal: Non-violent Restraint(s) Outcome: Adequate for Discharge
